# Patient Record
Sex: Male | Born: 1980 | Race: White | Hispanic: No | Marital: Single | Smoking: Former smoker
Health system: Southern US, Community
[De-identification: ages and names within clinical notes are randomized; demographics above are authoritative.]

## PROBLEM LIST (undated history)

## (undated) DIAGNOSIS — S2239XA Fracture of one rib, unspecified side, initial encounter for closed fracture: Secondary | ICD-10-CM

## (undated) DIAGNOSIS — W19XXXA Unspecified fall, initial encounter: Secondary | ICD-10-CM

## (undated) DIAGNOSIS — B958 Unspecified staphylococcus as the cause of diseases classified elsewhere: Secondary | ICD-10-CM

## (undated) DIAGNOSIS — L0390 Cellulitis, unspecified: Secondary | ICD-10-CM

## (undated) HISTORY — PX: NO PAST SURGERIES: SHX2092

---

## 2016-11-28 ENCOUNTER — Emergency Department (HOSPITAL_COMMUNITY)
Admission: EM | Admit: 2016-11-28 | Discharge: 2016-11-28 | Disposition: A | Discharge status: Home / Self Care | Payer: Self-pay | Attending: Emergency Medicine | Admitting: Emergency Medicine

## 2016-11-28 ENCOUNTER — Encounter (HOSPITAL_COMMUNITY): Payer: Self-pay | Admitting: Emergency Medicine

## 2016-11-28 DIAGNOSIS — Z720 Tobacco use: Secondary | ICD-10-CM

## 2016-11-28 DIAGNOSIS — K029 Dental caries, unspecified: Secondary | ICD-10-CM | POA: Insufficient documentation

## 2016-11-28 DIAGNOSIS — S025XXA Fracture of tooth (traumatic), initial encounter for closed fracture: Secondary | ICD-10-CM

## 2016-11-28 DIAGNOSIS — K0381 Cracked tooth: Secondary | ICD-10-CM | POA: Insufficient documentation

## 2016-11-28 MED ORDER — PENICILLIN V POTASSIUM 500 MG PO TABS: 1000.0000 mg | ORAL_TABLET | Freq: Two times a day (BID) | ORAL | 0 refills | Status: DC

## 2016-11-28 NOTE — Discharge Instructions (Signed)
Apply warm compresses to jaw throughout the day. Take antibiotic until finished. Use tylenol or motrin as needed for pain. Perform salt water swishes to help with pain/swelling. Use over the counter oragel as needed for additional relief. Followup with a dentist is very important for ongoing evaluation and management of recurrent dental pain, follow up with your dentist in the next 1 week for ongoing management of your dental issue. STOP SMOKING! Return to emergency department for emergent changing or worsening symptoms.

## 2016-11-28 NOTE — ED Triage Notes (Signed)
Patient reports left upper molar dental pain x2 days. States he was eating popcorn and a kernel "really messed up" his tooth. Denies fevers.

## 2016-11-28 NOTE — ED Provider Notes (Signed)
[facility]-EMERGENCY DEPT Provider Note   CSN: [id number] Arrival date & time: 11/28/16  1904     History   Chief Complaint Chief Complaint  Patient presents with  . Dental Pain    HPI Douglas Daniels is a 35 y.o. male who presents to the ED with complaints of left upper molar pain onset 3 days ago after he was eating popcorn and broke the tooth. He describes the pain as 9/10 burning intermittent nonradiating left upper dental pain worse with chewing and significantly improved with ibuprofen and BC powders. He has a Education officer, communitydentist, last saw him one year ago, is traveling from [place] and will not return until January 4 at which time he can see his dentist again. +Smoker. He denies gum swelling/drainage, face swelling, rhinorrhea, sore throat, ear pain/drainage, trismus, drooling, fevers, chills, CP, SOB, abd pain, N/V/D/C, hematuria, dysuria, myalgias, arthralgias, numbness, tingling, weakness, or any other complaints at this time. NKDA.    The history is provided by the patient and medical records. No language interpreter was used.  Dental Pain   This is a new problem. The current episode started more than 2 days ago. The problem occurs daily. The problem has not changed since onset.The pain is at a severity of 9/10. The pain is moderate. Treatments tried: ibuprofen and BC powders. The treatment provided significant relief.    History reviewed. No pertinent past medical history.  There are no active problems to display for this patient.   History reviewed. No pertinent surgical history.     Home Medications    Prior to Admission medications   Not on File    Family History No family history on file.  Social History Social History  Substance Use Topics  . Smoking status: Not on file  . Smokeless tobacco: Not on file  . Alcohol use Not on file     Allergies   Patient has no known allergies.   Review of Systems Review of Systems  Constitutional: Negative for chills and fever.    HENT: Positive for dental problem. Negative for drooling, ear discharge, ear pain, facial swelling, rhinorrhea, sore throat and trouble swallowing.   Respiratory: Negative for shortness of breath.   Cardiovascular: Negative for chest pain.  Gastrointestinal: Negative for abdominal pain, constipation, diarrhea, nausea and vomiting.  Genitourinary: Negative for dysuria and hematuria.  Musculoskeletal: Negative for arthralgias and myalgias.  Skin: Negative for color change.  Allergic/Immunologic: Negative for immunocompromised state.  Neurological: Negative for weakness and numbness.  Psychiatric/Behavioral: Negative for confusion.   10 Systems reviewed and are negative for acute change except as noted in the HPI.   Physical Exam Updated Vital Signs BP 138/98 (BP Location: Left Arm)   Pulse 84   Temp 98.2 F (36.8 C) (Oral)   Resp 18   Ht 6' (1.829 m)   Wt 89.8 kg   SpO2 97%   BMI 26.85 kg/m   Physical Exam  Constitutional: He is oriented to person, place, and time. Vital signs are normal. He appears well-developed and well-nourished.  Non-toxic appearance. No distress.  Afebrile, nontoxic, NAD  HENT:  Head: Normocephalic and atraumatic.  Nose: Nose normal.  Mouth/Throat: Uvula is midline, oropharynx is clear and moist and mucous membranes are normal. No trismus in the jaw. Abnormal dentition (broken tooth). Dental caries present. No dental abscesses or uvula swelling. Tonsils are 0 on the right. Tonsils are 0 on the left. No tonsillar exudate.  L upper molar #15 with small broken area to inner aspect  of the tooth, dental caries of the tooth, no surrounding gingival erythema or swelling, no evidence of abscess, no evidence of ludwig's. Nose clear. Oropharynx clear and moist, without uvular swelling or deviation, no trismus or drooling, no tonsillar swelling or erythema, no exudates.    Eyes: Conjunctivae and EOM are normal. Right eye exhibits no discharge. Left eye exhibits no  discharge.  Neck: Normal range of motion. Neck supple.  Cardiovascular: Normal rate and intact distal pulses.   Pulmonary/Chest: Effort normal. No respiratory distress.  Abdominal: Normal appearance. He exhibits no distension.  Musculoskeletal: Normal range of motion.  Neurological: He is alert and oriented to person, place, and time. He has normal strength. No sensory deficit.  Skin: Skin is warm, dry and intact. No rash noted.  Psychiatric: He has a normal mood and affect.  Nursing note and vitals reviewed.    ED Treatments / Results  Labs (all labs ordered are listed, but only abnormal results are displayed) Labs Reviewed - No data to display  EKG  EKG Interpretation None       Radiology No results found.  Procedures Procedures (including critical care time)  Medications Ordered in ED Medications - No data to display   Initial Impression / Assessment and Plan / ED Course  I have reviewed the triage vital signs and the nursing notes.  Pertinent labs & imaging results that were available during my care of the patient were reviewed by me and considered in my medical decision making (see chart for details).  Clinical Course     35 y.o. male here with Dental pain associated with broken tooth but without evidence of dental abscess with patient afebrile, non toxic appearing and swallowing secretions well, no evidence of ludwig's. I will also give PCN VK. Smoking cessation advised. Tylenol/motrin discussed. OTC remedies discussed for symptom control. Pt has a dentist at home, I stressed the importance of dental follow up for ultimate management of dental pain.  I have also discussed reasons to return immediately to the ER.  Patient expresses understanding and agrees with plan.    Final Clinical Impressions(s) / ED Diagnoses   Final diagnoses:  Pain due to dental caries  Closed fracture of tooth, initial encounter  Tobacco user    New Prescriptions New Prescriptions    PENICILLIN V POTASSIUM (VEETID) 500 MG TABLET    Take 2 tablets (1,000 mg total) by mouth 2 (two) times daily. X 7 days     Allen DerryMercedes Camprubi-Soms, PA-C 11/28/16 1945    Shaune Pollackameron Isaacs, MD 11/30/16 1135

## 2017-05-07 DIAGNOSIS — S2249XA Multiple fractures of ribs, unspecified side, initial encounter for closed fracture: Secondary | ICD-10-CM

## 2017-05-07 DIAGNOSIS — L0390 Cellulitis, unspecified: Secondary | ICD-10-CM

## 2017-05-07 HISTORY — DX: Cellulitis, unspecified: L03.90

## 2017-05-07 HISTORY — DX: Multiple fractures of ribs, unspecified side, initial encounter for closed fracture: S22.49XA

## 2017-05-15 ENCOUNTER — Emergency Department (HOSPITAL_COMMUNITY): Payer: Self-pay

## 2017-05-15 ENCOUNTER — Inpatient Hospital Stay (HOSPITAL_COMMUNITY)
Admission: EM | Admit: 2017-05-15 | Discharge: 2017-05-18 | DRG: 183 | Disposition: A | Discharge status: Home / Self Care | Payer: Self-pay | Attending: General Surgery | Admitting: General Surgery

## 2017-05-15 ENCOUNTER — Encounter (HOSPITAL_COMMUNITY): Payer: Self-pay | Admitting: Emergency Medicine

## 2017-05-15 DIAGNOSIS — S27321A Contusion of lung, unilateral, initial encounter: Secondary | ICD-10-CM | POA: Diagnosis present

## 2017-05-15 DIAGNOSIS — J9811 Atelectasis: Secondary | ICD-10-CM | POA: Diagnosis not present

## 2017-05-15 DIAGNOSIS — F17210 Nicotine dependence, cigarettes, uncomplicated: Secondary | ICD-10-CM | POA: Diagnosis present

## 2017-05-15 DIAGNOSIS — Y908 Blood alcohol level of 240 mg/100 ml or more: Secondary | ICD-10-CM | POA: Diagnosis present

## 2017-05-15 DIAGNOSIS — S2242XA Multiple fractures of ribs, left side, initial encounter for closed fracture: Principal | ICD-10-CM | POA: Diagnosis present

## 2017-05-15 DIAGNOSIS — J942 Hemothorax: Secondary | ICD-10-CM

## 2017-05-15 DIAGNOSIS — J939 Pneumothorax, unspecified: Secondary | ICD-10-CM

## 2017-05-15 DIAGNOSIS — R1010 Upper abdominal pain, unspecified: Secondary | ICD-10-CM | POA: Diagnosis present

## 2017-05-15 DIAGNOSIS — S2249XA Multiple fractures of ribs, unspecified side, initial encounter for closed fracture: Secondary | ICD-10-CM

## 2017-05-15 DIAGNOSIS — F10920 Alcohol use, unspecified with intoxication, uncomplicated: Secondary | ICD-10-CM

## 2017-05-15 DIAGNOSIS — F10120 Alcohol abuse with intoxication, uncomplicated: Secondary | ICD-10-CM | POA: Diagnosis present

## 2017-05-15 DIAGNOSIS — S2239XA Fracture of one rib, unspecified side, initial encounter for closed fracture: Secondary | ICD-10-CM

## 2017-05-15 DIAGNOSIS — S0081XA Abrasion of other part of head, initial encounter: Secondary | ICD-10-CM | POA: Diagnosis present

## 2017-05-15 DIAGNOSIS — S0083XA Contusion of other part of head, initial encounter: Secondary | ICD-10-CM | POA: Diagnosis present

## 2017-05-15 DIAGNOSIS — W19XXXA Unspecified fall, initial encounter: Secondary | ICD-10-CM

## 2017-05-15 DIAGNOSIS — S0031XA Abrasion of nose, initial encounter: Secondary | ICD-10-CM | POA: Diagnosis present

## 2017-05-15 DIAGNOSIS — S272XXA Traumatic hemopneumothorax, initial encounter: Secondary | ICD-10-CM | POA: Diagnosis present

## 2017-05-15 DIAGNOSIS — S40212A Abrasion of left shoulder, initial encounter: Secondary | ICD-10-CM | POA: Diagnosis present

## 2017-05-15 HISTORY — DX: Unspecified fall, initial encounter: W19.XXXA

## 2017-05-15 LAB — COMPREHENSIVE METABOLIC PANEL
ALT: 18 U/L (ref 17–63)
ANION GAP: 12 (ref 5–15)
AST: 30 U/L (ref 15–41)
Albumin: 4.5 g/dL (ref 3.5–5.0)
Alkaline Phosphatase: 67 U/L (ref 38–126)
BUN: 14 mg/dL (ref 6–20)
CO2: 19 mmol/L — ABNORMAL LOW (ref 22–32)
Calcium: 8.6 mg/dL — ABNORMAL LOW (ref 8.9–10.3)
Chloride: 108 mmol/L (ref 101–111)
Creatinine, Ser: 1.06 mg/dL (ref 0.61–1.24)
Glucose, Bld: 129 mg/dL — ABNORMAL HIGH (ref 65–99)
POTASSIUM: 3.8 mmol/L (ref 3.5–5.1)
Sodium: 139 mmol/L (ref 135–145)
TOTAL PROTEIN: 7.5 g/dL (ref 6.5–8.1)
Total Bilirubin: 0.5 mg/dL (ref 0.3–1.2)

## 2017-05-15 LAB — CBC
HEMATOCRIT: 46.8 % (ref 39.0–52.0)
HEMATOCRIT: 45.9 % (ref 39.0–52.0)
HEMOGLOBIN: 15.9 g/dL (ref 13.0–17.0)
Hemoglobin: 16 g/dL (ref 13.0–17.0)
MCH: 28.7 pg (ref 26.0–34.0)
MCH: 28.9 pg (ref 26.0–34.0)
MCHC: 34.2 g/dL (ref 30.0–36.0)
MCHC: 34.6 g/dL (ref 30.0–36.0)
MCV: 83.9 fL (ref 78.0–100.0)
MCV: 83.5 fL (ref 78.0–100.0)
Platelets: 233 10*3/uL (ref 150–400)
Platelets: 240 10*3/uL (ref 150–400)
RBC: 5.58 MIL/uL (ref 4.22–5.81)
RBC: 5.5 MIL/uL (ref 4.22–5.81)
RDW: 12.6 % (ref 11.5–15.5)
RDW: 12.7 % (ref 11.5–15.5)
WBC: 19.2 10*3/uL — AB (ref 4.0–10.5)
WBC: 17 10*3/uL — ABNORMAL HIGH (ref 4.0–10.5)

## 2017-05-15 LAB — URINALYSIS, ROUTINE W REFLEX MICROSCOPIC
BILIRUBIN URINE: NEGATIVE
Glucose, UA: NEGATIVE mg/dL
Hgb urine dipstick: NEGATIVE
KETONES UR: NEGATIVE mg/dL
LEUKOCYTES UA: NEGATIVE
NITRITE: NEGATIVE
PH: 5 (ref 5.0–8.0)
Protein, ur: NEGATIVE mg/dL
Specific Gravity, Urine: 1.017 (ref 1.005–1.030)

## 2017-05-15 LAB — I-STAT CG4 LACTIC ACID, ED: LACTIC ACID, VENOUS: 2.06 mmol/L — AB (ref 0.5–1.9)

## 2017-05-15 LAB — I-STAT CHEM 8, ED
BUN: 17 mg/dL (ref 6–20)
Calcium, Ion: 1.04 mmol/L — ABNORMAL LOW (ref 1.15–1.40)
Chloride: 107 mmol/L (ref 101–111)
Creatinine, Ser: 1.3 mg/dL — ABNORMAL HIGH (ref 0.61–1.24)
Glucose, Bld: 131 mg/dL — ABNORMAL HIGH (ref 65–99)
HCT: 48 % (ref 39.0–52.0)
Hemoglobin: 16.3 g/dL (ref 13.0–17.0)
Potassium: 4 mmol/L (ref 3.5–5.1)
Sodium: 142 mmol/L (ref 135–145)
TCO2: 22 mmol/L (ref 0–100)

## 2017-05-15 LAB — HIV ANTIBODY (ROUTINE TESTING W REFLEX): HIV Screen 4th Generation wRfx: NONREACTIVE

## 2017-05-15 LAB — SAMPLE TO BLOOD BANK

## 2017-05-15 LAB — PROTIME-INR
INR: 0.99
Prothrombin Time: 13.1 s (ref 11.4–15.2)

## 2017-05-15 LAB — CREATININE, SERUM
Creatinine, Ser: 0.95 mg/dL (ref 0.61–1.24)
GFR calc Af Amer: >60 mL/min (ref >60)
GFR calc non Af Amer: >60 mL/min (ref >60)

## 2017-05-15 LAB — ETHANOL: Alcohol, Ethyl (B): 292 mg/dL — ABNORMAL HIGH (ref <5)

## 2017-05-15 LAB — CDS SEROLOGY

## 2017-05-15 MED ORDER — IOPAMIDOL (ISOVUE-300) INJECTION 61%
INTRAVENOUS | Status: AC
  Administered 2017-05-15: 100 mL
  Filled 2017-05-15: qty 100

## 2017-05-15 MED ORDER — FENTANYL CITRATE (PF) 100 MCG/2ML IJ SOLN
INTRAMUSCULAR | Status: AC
  Filled 2017-05-15: qty 2

## 2017-05-15 MED ORDER — FENTANYL CITRATE (PF) 100 MCG/2ML IJ SOLN
INTRAMUSCULAR | Status: AC | PRN
  Administered 2017-05-15: 50 ug via INTRAVENOUS

## 2017-05-15 MED ORDER — OXYCODONE HCL 5 MG PO TABS: 5.0000 mg | ORAL_TABLET | ORAL | Status: DC | PRN

## 2017-05-15 MED ORDER — NICOTINE 14 MG/24HR TD PT24
14.0000 mg | MEDICATED_PATCH | Freq: Every day | TRANSDERMAL | Status: DC
  Administered 2017-05-15 – 2017-05-17 (×3): 14 mg via TRANSDERMAL
  Filled 2017-05-15 (×3): qty 1

## 2017-05-15 MED ORDER — SODIUM CHLORIDE 0.9 % IV SOLN: 250.0000 mL | INTRAVENOUS | Status: DC | PRN

## 2017-05-15 MED ORDER — TETANUS-DIPHTH-ACELL PERTUSSIS 5-2.5-18.5 LF-MCG/0.5 IM SUSP
INTRAMUSCULAR | Status: AC
  Filled 2017-05-15: qty 0.5

## 2017-05-15 MED ORDER — OXYCODONE HCL 5 MG PO TABS
10.0000 mg | ORAL_TABLET | ORAL | Status: DC | PRN
  Administered 2017-05-15 – 2017-05-17 (×12): 10 mg via ORAL
  Filled 2017-05-15 (×12): qty 2

## 2017-05-15 MED ORDER — TETANUS-DIPHTH-ACELL PERTUSSIS 5-2.5-18.5 LF-MCG/0.5 IM SUSP
0.5000 mL | Freq: Once | INTRAMUSCULAR | Status: AC
  Administered 2017-05-15: 0.5 mL via INTRAMUSCULAR

## 2017-05-15 MED ORDER — FENTANYL CITRATE (PF) 100 MCG/2ML IJ SOLN
25.0000 ug | INTRAMUSCULAR | Status: DC | PRN
  Administered 2017-05-15 (×2): 50 ug via INTRAVENOUS
  Filled 2017-05-15 (×2): qty 2

## 2017-05-15 MED ORDER — ACETAMINOPHEN 325 MG PO TABS
650.0000 mg | ORAL_TABLET | ORAL | Status: DC | PRN
  Administered 2017-05-15 – 2017-05-17 (×11): 650 mg via ORAL
  Filled 2017-05-15 (×11): qty 2

## 2017-05-15 MED ORDER — ENOXAPARIN SODIUM 40 MG/0.4ML ~~LOC~~ SOLN
40.0000 mg | SUBCUTANEOUS | Status: DC
  Administered 2017-05-16: 40 mg via SUBCUTANEOUS
  Filled 2017-05-15 (×3): qty 0.4

## 2017-05-15 MED ORDER — SODIUM CHLORIDE 0.9% FLUSH
3.0000 mL | Freq: Two times a day (BID) | INTRAVENOUS | Status: DC
  Administered 2017-05-16 (×2): 3 mL via INTRAVENOUS

## 2017-05-15 MED ORDER — FENTANYL CITRATE (PF) 100 MCG/2ML IJ SOLN
25.0000 ug | Freq: Once | INTRAMUSCULAR | Status: AC
  Administered 2017-05-15: 25 ug via INTRAVENOUS

## 2017-05-15 MED ORDER — SODIUM CHLORIDE 0.9% FLUSH: 3.0000 mL | INTRAVENOUS | Status: DC | PRN

## 2017-05-15 MED ORDER — SENNOSIDES-DOCUSATE SODIUM 8.6-50 MG PO TABS
2.0000 | ORAL_TABLET | Freq: Two times a day (BID) | ORAL | Status: DC
  Administered 2017-05-15 – 2017-05-18 (×4): 2 via ORAL
  Filled 2017-05-15 (×5): qty 2

## 2017-05-15 MED ORDER — ONDANSETRON HCL 4 MG/2ML IJ SOLN: 4.0000 mg | Freq: Four times a day (QID) | INTRAMUSCULAR | Status: DC | PRN

## 2017-05-15 MED ORDER — METHOCARBAMOL 1000 MG/10ML IJ SOLN
1000.0000 mg | Freq: Three times a day (TID) | INTRAMUSCULAR | Status: DC | PRN
  Administered 2017-05-15 – 2017-05-16 (×2): 1000 mg via INTRAVENOUS
  Filled 2017-05-15 (×5): qty 10

## 2017-05-15 MED ORDER — ONDANSETRON HCL 4 MG PO TABS: 4.0000 mg | ORAL_TABLET | Freq: Four times a day (QID) | ORAL | Status: DC | PRN

## 2017-05-15 NOTE — ED Notes (Signed)
MD at bedside. Pt resting comfortably.

## 2017-05-15 NOTE — ED Provider Notes (Signed)
[facility]-EMERGENCY DEPT Provider Note   CSN: [id number] Arrival date & time: 05/15/17  0111    By signing my name below, I, Douglas Daniels, attest that this documentation has been prepared under the direction and in the presence of Saga Balthazar, Jeannett Senior, MD . Electronically Signed: Elsie Daniels, ED Scribe. 05/15/2017. 1:17 AM.  History   Chief Complaint Chief Complaint  Patient presents with  . Chest Pain  . Alcohol Intoxication   LEVEL 5 CAVEAT: ALCOHOL INTOXICATION   HPI Douglas Daniels is a 36 y.o. male who brought in by EMS to the Emergency Department complaining of sudden-onset left sided chest wall pain s/p fall PTA. Pt is also experiencing associated abrasions to left sided face/left shoulder/bridge of nose and left upper abdominal pain. He was not given any medications by EMS while en route to the ED. EMS reports that the pt was found outside a bar downtown and informed EMS that he fell which caused his injuries. Pt endorses that he did consume ETOH tonight prior to the onset of his fall. Pt states that his left sided chest wall pain is exacerbated with inspiration. Pt reports his last Tetanus vaccination was 5 years ago. He denies neck pain and any other symptoms. Denies hx of abdominal surgeries.     The history is provided by the patient and the EMS personnel. No language interpreter was used.    History reviewed. No pertinent past medical history.  There are no active problems to display for this patient.   No past surgical history on file.     Home Medications    Prior to Admission medications   Medication Sig Start Date End Date Taking? Authorizing Provider  penicillin v potassium (VEETID) 500 MG tablet Take 2 tablets (1,000 mg total) by mouth 2 (two) times daily. X 7 days 11/28/16   Street, [place], [place]    Family History No family history on file.  Social History Social History  Substance Use Topics  . Smoking status: Current Every Day Smoker    Packs/day: 1.00      Types: Cigarettes  . Smokeless tobacco: Never Used  . Alcohol use Yes     Allergies   Patient has no known allergies.   Review of Systems Review of Systems  Unable to perform ROS: Other (Alcohol Intoxication)   Physical Exam Updated Vital Signs BP (!) 148/92   Pulse 90   Temp 98.1 F (36.7 C)   Resp 20   Ht 6\' 2"  (1.88 m)   Wt 200 lb (90.7 kg)   SpO2 93%   BMI 25.68 kg/m   Physical Exam  Constitutional: He is oriented to person, place, and time. He appears well-developed and well-nourished. No distress.  Uncooperative and belligerent.  HENT:  Head: Normocephalic. Head is with abrasion.  Mouth/Throat: Oropharynx is clear and moist. No oropharyngeal exudate.  Abrasions to left cheek and bridge of nose.   Eyes: Conjunctivae and EOM are normal. Pupils are equal, round, and reactive to light.  Neck: Normal range of motion. Neck supple.  No meningismus.  Cardiovascular: Normal rate, regular rhythm, normal heart sounds and intact distal pulses.   No murmur heard. Pulmonary/Chest: Effort normal. No respiratory distress. He has decreased breath sounds. He exhibits no crepitus.  No appreciated crepitus. Decreased breath sounds on the left lateral ribs with tenderness noted. No obvious ecchymosis to chest wall.   Abdominal: Soft. There is tenderness. There is no rebound and no guarding.  Left upper abdominal tenderness.  Musculoskeletal: Normal range  of motion. He exhibits no edema or tenderness.  Abrasion to left posterior shoulder. C-spine is non-tender. FROM. Able to move all extremities.  Neurological: He is alert and oriented to person, place, and time. No cranial nerve deficit. He exhibits normal muscle tone. Coordination normal.   5/5 strength throughout. CN 2-12 intact.Equal grip strength.   Skin: Skin is warm.  Psychiatric: He has a normal mood and affect. His behavior is normal.  Nursing note and vitals reviewed.    ED Treatments / Results  DIAGNOSTIC  STUDIES:  Oxygen Saturation is 93% on RA, low by my interpretation.    COORDINATION OF CARE:  1:32 AM Discussed treatment plan with pt at bedside and pt agreed to plan.  2:58 AM Consult with Trauma who will admitt to hospital.    Labs (all labs ordered are listed, but only abnormal results are displayed) Labs Reviewed  COMPREHENSIVE METABOLIC PANEL - Abnormal; Notable for the following:       Result Value   CO2 19 (*)    Glucose, Bld 129 (*)    Calcium 8.6 (*)    All other components within normal limits  CBC - Abnormal; Notable for the following:    WBC 19.2 (*)    All other components within normal limits  ETHANOL - Abnormal; Notable for the following:    Alcohol, Ethyl (B) 292 (*)    All other components within normal limits  I-STAT CHEM 8, ED - Abnormal; Notable for the following:    Creatinine, Ser 1.30 (*)    Glucose, Bld 131 (*)    Calcium, Ion 1.04 (*)    All other components within normal limits  I-STAT CG4 LACTIC ACID, ED - Abnormal; Notable for the following:    Lactic Acid, Venous 2.06 (*)    All other components within normal limits  CDS SEROLOGY  URINALYSIS, ROUTINE W REFLEX MICROSCOPIC  PROTIME-INR  HIV ANTIBODY (ROUTINE TESTING)  CBC  CREATININE, SERUM  SAMPLE TO BLOOD BANK    EKG  EKG Interpretation  Date/Time:  Saturday May 15 2017 05:01:04 EDT Ventricular Rate:  93 PR Interval:    QRS Duration: 87 QT Interval:  339 QTC Calculation: 422 R Axis:   59 Text Interpretation:  Sinus rhythm Probable anteroseptal infarct, old No previous ECGs available Confirmed by Glynn Octave [phone]) on 05/15/2017 5:22:15 AM       Radiology Ct Head Wo Contrast  Result Date: 05/15/2017 CLINICAL DATA:  Initial evaluation for acute trauma, fall. EXAM: CT HEAD WITHOUT CONTRAST CT MAXILLOFACIAL WITHOUT CONTRAST CT CERVICAL SPINE WITHOUT CONTRAST TECHNIQUE: Multidetector CT imaging of the head, cervical spine, and maxillofacial structures were performed using the  standard protocol without intravenous contrast. Multiplanar CT image reconstructions of the cervical spine and maxillofacial structures were also generated. COMPARISON:  None. FINDINGS: CT HEAD FINDINGS Brain: Cerebral volume normal. No acute intracranial hemorrhage. No evidence for acute large vessel territory infarct. No mass lesion, midline shift or mass effect. No hydrocephalus. Mild prominence of the extra-axial space overlying the left frontal convexity without discrete extra-axial fluid collection. Vascular: No asymmetric hyperdense vessel. Skull: Scalp soft tissues demonstrate no acute abnormality. Calvarium intact. Other: No mastoid effusion. CT MAXILLOFACIAL FINDINGS Osseous: Zygomatic arches intact. No acute maxillary fracture. Pterygoid plates intact. Nasal bones intact. Nasal septum bowed to the left but intact. Visualized mandible intact. Mandibular condyles normally situated. No acute abnormality about the dentition. Scattered dental caries noted. Orbits: Globes oral soft tissues within normal limits. Bony orbits intact. Sinuses: Clear.  Soft tissues: Mild soft tissue swelling at the lateral left face. No other appreciable soft tissue injury. CT CERVICAL SPINE FINDINGS Alignment: Mild dextroscoliosis, which may in part be related positioning. Vertebral bodies otherwise normally aligned with preservation of the normal cervical lordosis. Skull base and vertebrae: Skullbase intact. Normal C1-2 articulations preserved. Dens is intact. Vertebral body heights maintained. No acute fracture. Soft tissues and spinal canal: Soft tissues of the neck demonstrate no acute abnormality. No prevertebral edema. Disc levels:  Mild degenerative spondylolysis noted at C5-6. Upper chest: Visualized upper chest demonstrates no acute abnormality. Visualized lung apices are clear. No apical pneumothorax. IMPRESSION: CT HEAD: No acute intracranial process identified. CT MAXILLOFACIAL: 1. Mild left facial soft tissue  swelling/contusion. 2. No other acute maxillofacial injury identified.  No fracture. CT CERVICAL SPINE: No acute traumatic injury within cervical spine. Electronically Signed   By: Rise MuBenjamin  McClintock M.D.   On: 05/15/2017 02:37   Ct Chest W Contrast  Result Date: 05/15/2017 CLINICAL DATA:  Left chest wall pain.  Level 2 trauma.  Fall. EXAM: CT CHEST, ABDOMEN, AND PELVIS WITH CONTRAST TECHNIQUE: Multidetector CT imaging of the chest, abdomen and pelvis was performed following the standard protocol during bolus administration of intravenous contrast. CONTRAST:  100mL ISOVUE-300 IOPAMIDOL (ISOVUE-300) INJECTION 61% COMPARISON:  Chest and pelvic radiographs earlier this day. FINDINGS: CT CHEST FINDINGS Cardiovascular: No acute traumatic aortic injury. The heart is normal in size. No pericardial fluid. Mediastinum/Nodes: No pneumomediastinum. No mediastinal hematoma. No adenopathy. Visualized thyroid gland is normal. The esophagus is decompressed. Small hiatal hernia. Lungs/Pleura: Tiny left pneumothorax most prominent anteriorly. Patchy left lower lobe opacities may be atelectasis or mild contusion. Trace left hemothorax. Probable dependent atelectasis in the right lower lobe. Trachea and mainstem bronchi are patent. Minimal adherent mucus in the upper trachea. Musculoskeletal: Fractures of left anterior third through seventh ribs, fourth through sixth rib fractures minimally displaced. Tiny adjacent extrapleural air and pneumothorax. Motion artifact partially obscures evaluation of lower most ribs. The sternum is intact. No fracture or subluxation of the thoracic spine. Visualize clavicles and shoulder girdles are intact. Soft tissue stranding of the left anterior lateral chest wall. CT ABDOMEN PELVIS FINDINGS Hepatobiliary: No hepatic injury or perihepatic hematoma. Gallbladder is unremarkable Pancreas: No ductal dilatation or inflammation. Spleen: No splenic injury or perisplenic hematoma. Adrenals/Urinary  Tract: No adrenal hemorrhage or renal injury identified. Bladder is unremarkable. Stomach/Bowel: No evidence of bowel injury. No bowel wall thickening. No mesenteric hematoma. Normal appendix. Vascular/Lymphatic: No acute vascular injury. Abdominal aorta and IVC are intact. No retroperitoneal fluid. No adenopathy. Reproductive: Normal. Other: Tiny foci of extraperitoneal air in the left upper abdomen are likely tracking from rib fractures. No free fluid or ascites. Musculoskeletal: No fracture of the bony pelvis or lumbar spine. IMPRESSION: 1. Left anterior rib fractures 3-7, fourth through sixth fractures are displaced. Tiny associated left hemopneumothorax. Probable mild pulmonary contusion in the left lower lobe. 2. No additional acute traumatic injury to the chest, abdomen, or pelvis. 3. Small hiatal hernia, incidentally noted. Critical Value/emergent results were called by telephone at the time of interpretation on 05/15/2017 at 2:19 am to Dr. Glynn OctaveSTEPHEN Dierre Crevier , who verbally acknowledged these results. Electronically Signed   By: Rubye OaksMelanie  Ehinger M.D.   On: 05/15/2017 02:19   Ct Cervical Spine Wo Contrast  Result Date: 05/15/2017 CLINICAL DATA:  Initial evaluation for acute trauma, fall. EXAM: CT HEAD WITHOUT CONTRAST CT MAXILLOFACIAL WITHOUT CONTRAST CT CERVICAL SPINE WITHOUT CONTRAST TECHNIQUE: Multidetector CT imaging of the head,  cervical spine, and maxillofacial structures were performed using the standard protocol without intravenous contrast. Multiplanar CT image reconstructions of the cervical spine and maxillofacial structures were also generated. COMPARISON:  None. FINDINGS: CT HEAD FINDINGS Brain: Cerebral volume normal. No acute intracranial hemorrhage. No evidence for acute large vessel territory infarct. No mass lesion, midline shift or mass effect. No hydrocephalus. Mild prominence of the extra-axial space overlying the left frontal convexity without discrete extra-axial fluid collection.  Vascular: No asymmetric hyperdense vessel. Skull: Scalp soft tissues demonstrate no acute abnormality. Calvarium intact. Other: No mastoid effusion. CT MAXILLOFACIAL FINDINGS Osseous: Zygomatic arches intact. No acute maxillary fracture. Pterygoid plates intact. Nasal bones intact. Nasal septum bowed to the left but intact. Visualized mandible intact. Mandibular condyles normally situated. No acute abnormality about the dentition. Scattered dental caries noted. Orbits: Globes oral soft tissues within normal limits. Bony orbits intact. Sinuses: Clear. Soft tissues: Mild soft tissue swelling at the lateral left face. No other appreciable soft tissue injury. CT CERVICAL SPINE FINDINGS Alignment: Mild dextroscoliosis, which may in part be related positioning. Vertebral bodies otherwise normally aligned with preservation of the normal cervical lordosis. Skull base and vertebrae: Skullbase intact. Normal C1-2 articulations preserved. Dens is intact. Vertebral body heights maintained. No acute fracture. Soft tissues and spinal canal: Soft tissues of the neck demonstrate no acute abnormality. No prevertebral edema. Disc levels:  Mild degenerative spondylolysis noted at C5-6. Upper chest: Visualized upper chest demonstrates no acute abnormality. Visualized lung apices are clear. No apical pneumothorax. IMPRESSION: CT HEAD: No acute intracranial process identified. CT MAXILLOFACIAL: 1. Mild left facial soft tissue swelling/contusion. 2. No other acute maxillofacial injury identified.  No fracture. CT CERVICAL SPINE: No acute traumatic injury within cervical spine. Electronically Signed   By: Rise Mu M.D.   On: 05/15/2017 02:37   Ct Abdomen Pelvis W Contrast  Result Date: 05/15/2017 CLINICAL DATA:  Left chest wall pain.  Level 2 trauma.  Fall. EXAM: CT CHEST, ABDOMEN, AND PELVIS WITH CONTRAST TECHNIQUE: Multidetector CT imaging of the chest, abdomen and pelvis was performed following the standard protocol  during bolus administration of intravenous contrast. CONTRAST:  ISOVUE-300 IOPAMIDOL (ISOVUE-300) INJECTION 61% COMPARISON:  Chest and pelvic radiographs earlier this day. FINDINGS: CT CHEST FINDINGS Cardiovascular: No acute traumatic aortic injury. The heart is normal in size. No pericardial fluid. Mediastinum/Nodes: No pneumomediastinum. No mediastinal hematoma. No adenopathy. Visualized thyroid gland is normal. The esophagus is decompressed. Small hiatal hernia. Lungs/Pleura: Tiny left pneumothorax most prominent anteriorly. Patchy left lower lobe opacities may be atelectasis or mild contusion. Trace left hemothorax. Probable dependent atelectasis in the right lower lobe. Trachea and mainstem bronchi are patent. Minimal adherent mucus in the upper trachea. Musculoskeletal: Fractures of left anterior third through seventh ribs, fourth through sixth rib fractures minimally displaced. Tiny adjacent extrapleural air and pneumothorax. Motion artifact partially obscures evaluation of lower most ribs. The sternum is intact. No fracture or subluxation of the thoracic spine. Visualize clavicles and shoulder girdles are intact. Soft tissue stranding of the left anterior lateral chest wall. CT ABDOMEN PELVIS FINDINGS Hepatobiliary: No hepatic injury or perihepatic hematoma. Gallbladder is unremarkable Pancreas: No ductal dilatation or inflammation. Spleen: No splenic injury or perisplenic hematoma. Adrenals/Urinary Tract: No adrenal hemorrhage or renal injury identified. Bladder is unremarkable. Stomach/Bowel: No evidence of bowel injury. No bowel wall thickening. No mesenteric hematoma. Normal appendix. Vascular/Lymphatic: No acute vascular injury. Abdominal aorta and IVC are intact. No retroperitoneal fluid. No adenopathy. Reproductive: Normal. Other: Tiny foci of extraperitoneal air  in the left upper abdomen are likely tracking from rib fractures. No free fluid or ascites. Musculoskeletal: No fracture of the bony  pelvis or lumbar spine. IMPRESSION: 1. Left anterior rib fractures 3-7, fourth through sixth fractures are displaced. Tiny associated left hemopneumothorax. Probable mild pulmonary contusion in the left lower lobe. 2. No additional acute traumatic injury to the chest, abdomen, or pelvis. 3. Small hiatal hernia, incidentally noted. Critical Value/emergent results were called by telephone at the time of interpretation on 05/15/2017 at 2:19 am to Dr. Glynn Octave , who verbally acknowledged these results. Electronically Signed   By: Rubye Oaks M.D.   On: 05/15/2017 02:19   Dg Pelvis Portable  Result Date: 05/15/2017 CLINICAL DATA:  Possible fall, level 2 trauma. EXAM: PORTABLE PELVIS 1-2 VIEWS COMPARISON:  None. FINDINGS: The cortical margins of the bony pelvis are intact. No fracture. Pubic symphysis and sacroiliac joints are congruent. Both femoral heads are well-seated in the respective acetabula. IMPRESSION: No evidence of pelvic fracture. Electronically Signed   By: Rubye Oaks M.D.   On: 05/15/2017 01:47   Dg Chest Port 1 View  Result Date: 05/15/2017 CLINICAL DATA:  Chest pain.  Possible fall.  Level 2 trauma. EXAM: PORTABLE CHEST 1 VIEW COMPARISON:  None. FINDINGS: Low lung volumes. Heart size is normal. No large pneumothorax, pleural effusion or focal airspace disease. No evidence of displaced rib fracture. IMPRESSION: Low lung volumes without evidence of acute traumatic injury. Chest CT is planned. Electronically Signed   By: Rubye Oaks M.D.   On: 05/15/2017 01:46   Ct Maxillofacial Wo Contrast  Result Date: 05/15/2017 CLINICAL DATA:  Initial evaluation for acute trauma, fall. EXAM: CT HEAD WITHOUT CONTRAST CT MAXILLOFACIAL WITHOUT CONTRAST CT CERVICAL SPINE WITHOUT CONTRAST TECHNIQUE: Multidetector CT imaging of the head, cervical spine, and maxillofacial structures were performed using the standard protocol without intravenous contrast. Multiplanar CT image reconstructions of the  cervical spine and maxillofacial structures were also generated. COMPARISON:  None. FINDINGS: CT HEAD FINDINGS Brain: Cerebral volume normal. No acute intracranial hemorrhage. No evidence for acute large vessel territory infarct. No mass lesion, midline shift or mass effect. No hydrocephalus. Mild prominence of the extra-axial space overlying the left frontal convexity without discrete extra-axial fluid collection. Vascular: No asymmetric hyperdense vessel. Skull: Scalp soft tissues demonstrate no acute abnormality. Calvarium intact. Other: No mastoid effusion. CT MAXILLOFACIAL FINDINGS Osseous: Zygomatic arches intact. No acute maxillary fracture. Pterygoid plates intact. Nasal bones intact. Nasal septum bowed to the left but intact. Visualized mandible intact. Mandibular condyles normally situated. No acute abnormality about the dentition. Scattered dental caries noted. Orbits: Globes oral soft tissues within normal limits. Bony orbits intact. Sinuses: Clear. Soft tissues: Mild soft tissue swelling at the lateral left face. No other appreciable soft tissue injury. CT CERVICAL SPINE FINDINGS Alignment: Mild dextroscoliosis, which may in part be related positioning. Vertebral bodies otherwise normally aligned with preservation of the normal cervical lordosis. Skull base and vertebrae: Skullbase intact. Normal C1-2 articulations preserved. Dens is intact. Vertebral body heights maintained. No acute fracture. Soft tissues and spinal canal: Soft tissues of the neck demonstrate no acute abnormality. No prevertebral edema. Disc levels:  Mild degenerative spondylolysis noted at C5-6. Upper chest: Visualized upper chest demonstrates no acute abnormality. Visualized lung apices are clear. No apical pneumothorax. IMPRESSION: CT HEAD: No acute intracranial process identified. CT MAXILLOFACIAL: 1. Mild left facial soft tissue swelling/contusion. 2. No other acute maxillofacial injury identified.  No fracture. CT CERVICAL  SPINE: No acute traumatic injury  within cervical spine. Electronically Signed   By: Rise Mu M.D.   On: 05/15/2017 02:37    Procedures Procedures (including critical care time)  Medications Ordered in ED Medications  fentaNYL (SUBLIMAZE) injection ( Intravenous Canceled Entry 05/15/17 0130)  Tdap (BOOSTRIX) injection 0.5 mL (0.5 mLs Intramuscular Given 05/15/17 0204)  iopamidol (ISOVUE-300) 61 % injection (100 mLs  Contrast Given 05/15/17 0136)     Initial Impression / Assessment and Plan / ED Course  I have reviewed the triage vital signs and the nursing notes.  Pertinent labs & imaging results that were available during my care of the patient were reviewed by me and considered in my medical decision making (see chart for details).     Patient is intoxicated and belligerent. He had an apparent fall while drinking alcohol. Has abrasions to his left face, neck and chest. EMS reports decreased breath sounds in the left   ABCs are intact. Breath sounds are present. There is no obvious pneumothorax on bedside chest x-ray.  Patient given fentanyl for pain control. CT imaging will be obtained.  Imaging is remarkable for multiple left-sided rib fractures with small hemopneumothorax. CT head and C-spine are negative.  Patient's pain controlled with fentanyl. He does have minimal oxygen requirement. Given his multiple rib fractures and small hemopneumothorax, discussed with trauma surgery Dr. Janee Morn. Patient will be admitted for observation, pulmonary toilet and pain control.  EMERGENCY DEPARTMENT [place] FAST EXAM "Limited Ultrasound of the Abdomen and Pericardium" (FAST Exam).   INDICATIONS:Blunt trauma to the thorax and Blunt injury of abdomen Multiple views of the abdomen and pericardium are obtained with a multi-frequency probe.  PERFORMED BY: Myself IMAGES ARCHIVED?: Yes LIMITATIONS:  Emergent procedure INTERPRETATION:  No abdominal free fluid and No pericardial  effusion    Final Clinical Impressions(s) / ED Diagnoses   Final diagnoses:  Alcoholic intoxication without complication (HCC)  Closed fracture of multiple ribs of left side, initial encounter  Hemopneumothorax on left    New Prescriptions New Prescriptions   No medications on file  I personally performed the services described in this documentation, which was scribed in my presence. The recorded information has been reviewed and is accurate.     Glynn Octave, MD 05/15/17 [phone]

## 2017-05-15 NOTE — ED Notes (Signed)
Escorted to CT. Pt apologetic to staff.

## 2017-05-15 NOTE — H&P (Signed)
Douglas Daniels is an 36 y.o. male.   Chief Complaint: Left rib pain HPI: Douglas Daniels was brought in by EMS as a level 2 trauma after being found down outside a bar [place]. He had been drinking and he fell on his left side. He was complaining of left-sided pain exacerbated by deep breaths. Workup in the emergency department demonstrated left-sided rib fractures 3-7 with tiny occult PTX. I was asked to see him for admission.  History reviewed. No pertinent past medical history.  No past surgical history on file.  No family history on file. Social History:  reports that he has been smoking Cigarettes.  He has been smoking about 1.00 pack per day. He has never used smokeless tobacco. He reports that he drinks alcohol. He reports that he does not use drugs.  Allergies: No Known Allergies   (Not in a hospital admission)  Results for orders placed or performed during the hospital encounter of 05/15/17 (from the past 48 hour(s))  Sample to Blood Bank     Status: None   Collection Time: 05/15/17  1:14 AM  Result Value Ref Range   Blood Bank Specimen SAMPLE AVAILABLE FOR TESTING    Sample Expiration 05/16/2017   Ethanol     Status: Abnormal   Collection Time: 05/15/17  1:17 AM  Result Value Ref Range   Alcohol, Ethyl (B) 292 (H) <5 mg/dL    Comment:        LOWEST DETECTABLE LIMIT FOR SERUM ALCOHOL IS 5 mg/dL FOR MEDICAL PURPOSES ONLY   CDS serology     Status: None   Collection Time: 05/15/17  1:30 AM  Result Value Ref Range   CDS serology specimen      SPECIMEN WILL BE HELD FOR 14 DAYS IF TESTING IS REQUIRED  Comprehensive metabolic panel     Status: Abnormal   Collection Time: 05/15/17  1:30 AM  Result Value Ref Range   Sodium 139 135 - 145 mmol/L   Potassium 3.8 3.5 - 5.1 mmol/L   Chloride 108 101 - 111 mmol/L   CO2 19 (L) 22 - 32 mmol/L   Glucose, Bld 129 (H) 65 - 99 mg/dL   BUN 14 6 - 20 mg/dL   Creatinine, Ser 1.06 0.61 - 1.24 mg/dL   Calcium 8.6 (L) 8.9 - 10.3 mg/dL   Total  Protein 7.5 6.5 - 8.1 g/dL   Albumin 4.5 3.5 - 5.0 g/dL   AST 30 15 - 41 U/L   ALT 18 17 - 63 U/L   Alkaline Phosphatase 67 38 - 126 U/L   Total Bilirubin 0.5 0.3 - 1.2 mg/dL   GFR calc non Af Amer >60 >60 mL/min   GFR calc Af Amer >60 >60 mL/min    Comment: (NOTE) The eGFR has been calculated using the CKD EPI equation. This calculation has not been validated in all clinical situations. eGFR's persistently <60 mL/min signify possible Chronic Kidney Disease.    Anion gap 12 5 - 15  CBC     Status: Abnormal   Collection Time: 05/15/17  1:30 AM  Result Value Ref Range   WBC 19.2 (H) 4.0 - 10.5 K/uL   RBC 5.58 4.22 - 5.81 MIL/uL   Hemoglobin 16.0 13.0 - 17.0 g/dL   HCT 46.8 39.0 - 52.0 %   MCV 83.9 78.0 - 100.0 fL   MCH 28.7 26.0 - 34.0 pg   MCHC 34.2 30.0 - 36.0 g/dL   RDW 12.6 11.5 - 15.5 %  Platelets 233 150 - 400 K/uL  Protime-INR     Status: None   Collection Time: 05/15/17  1:30 AM  Result Value Ref Range   Prothrombin Time 13.1 11.4 - 15.2 seconds   INR 0.99   I-Stat Chem 8, ED     Status: Abnormal   Collection Time: 05/15/17  1:39 AM  Result Value Ref Range   Sodium 142 135 - 145 mmol/L   Potassium 4.0 3.5 - 5.1 mmol/L   Chloride 107 101 - 111 mmol/L   BUN 17 6 - 20 mg/dL   Creatinine, Ser 1.30 (H) 0.61 - 1.24 mg/dL   Glucose, Bld 131 (H) 65 - 99 mg/dL   Calcium, Ion 1.04 (L) 1.15 - 1.40 mmol/L   TCO2 22 0 - 100 mmol/L   Hemoglobin 16.3 13.0 - 17.0 g/dL   HCT 48.0 39.0 - 52.0 %  I-Stat CG4 Lactic Acid, ED     Status: Abnormal   Collection Time: 05/15/17  1:40 AM  Result Value Ref Range   Lactic Acid, Venous 2.06 (HH) 0.5 - 1.9 mmol/L   Comment NOTIFIED PHYSICIAN    Ct Head Wo Contrast  Result Date: 05/15/2017 CLINICAL DATA:  Initial evaluation for acute trauma, fall. EXAM: CT HEAD WITHOUT CONTRAST CT MAXILLOFACIAL WITHOUT CONTRAST CT CERVICAL SPINE WITHOUT CONTRAST TECHNIQUE: Multidetector CT imaging of the head, cervical spine, and maxillofacial structures  were performed using the standard protocol without intravenous contrast. Multiplanar CT image reconstructions of the cervical spine and maxillofacial structures were also generated. COMPARISON:  None. FINDINGS: CT HEAD FINDINGS Brain: Cerebral volume normal. No acute intracranial hemorrhage. No evidence for acute large vessel territory infarct. No mass lesion, midline shift or mass effect. No hydrocephalus. Mild prominence of the extra-axial space overlying the left frontal convexity without discrete extra-axial fluid collection. Vascular: No asymmetric hyperdense vessel. Skull: Scalp soft tissues demonstrate no acute abnormality. Calvarium intact. Other: No mastoid effusion. CT MAXILLOFACIAL FINDINGS Osseous: Zygomatic arches intact. No acute maxillary fracture. Pterygoid plates intact. Nasal bones intact. Nasal septum bowed to the left but intact. Visualized mandible intact. Mandibular condyles normally situated. No acute abnormality about the dentition. Scattered dental caries noted. Orbits: Globes oral soft tissues within normal limits. Bony orbits intact. Sinuses: Clear. Soft tissues: Mild soft tissue swelling at the lateral left face. No other appreciable soft tissue injury. CT CERVICAL SPINE FINDINGS Alignment: Mild dextroscoliosis, which may in part be related positioning. Vertebral bodies otherwise normally aligned with preservation of the normal cervical lordosis. Skull base and vertebrae: Skullbase intact. Normal C1-2 articulations preserved. Dens is intact. Vertebral body heights maintained. No acute fracture. Soft tissues and spinal canal: Soft tissues of the neck demonstrate no acute abnormality. No prevertebral edema. Disc levels:  Mild degenerative spondylolysis noted at C5-6. Upper chest: Visualized upper chest demonstrates no acute abnormality. Visualized lung apices are clear. No apical pneumothorax. IMPRESSION: CT HEAD: No acute intracranial process identified. CT MAXILLOFACIAL: 1. Mild left  facial soft tissue swelling/contusion. 2. No other acute maxillofacial injury identified.  No fracture. CT CERVICAL SPINE: No acute traumatic injury within cervical spine. Electronically Signed   By: Jeannine Boga M.D.   On: 05/15/2017 02:37   Ct Chest W Contrast  Result Date: 05/15/2017 CLINICAL DATA:  Left chest wall pain.  Level 2 trauma.  Fall. EXAM: CT CHEST, ABDOMEN, AND PELVIS WITH CONTRAST TECHNIQUE: Multidetector CT imaging of the chest, abdomen and pelvis was performed following the standard protocol during bolus administration of intravenous contrast. CONTRAST:  169m ISOVUE-300  IOPAMIDOL (ISOVUE-300) INJECTION 61% COMPARISON:  Chest and pelvic radiographs earlier this day. FINDINGS: CT CHEST FINDINGS Cardiovascular: No acute traumatic aortic injury. The heart is normal in size. No pericardial fluid. Mediastinum/Nodes: No pneumomediastinum. No mediastinal hematoma. No adenopathy. Visualized thyroid gland is normal. The esophagus is decompressed. Small hiatal hernia. Lungs/Pleura: Tiny left pneumothorax most prominent anteriorly. Patchy left lower lobe opacities may be atelectasis or mild contusion. Trace left hemothorax. Probable dependent atelectasis in the right lower lobe. Trachea and mainstem bronchi are patent. Minimal adherent mucus in the upper trachea. Musculoskeletal: Fractures of left anterior third through seventh ribs, fourth through sixth rib fractures minimally displaced. Tiny adjacent extrapleural air and pneumothorax. Motion artifact partially obscures evaluation of lower most ribs. The sternum is intact. No fracture or subluxation of the thoracic spine. Visualize clavicles and shoulder girdles are intact. Soft tissue stranding of the left anterior lateral chest wall. CT ABDOMEN PELVIS FINDINGS Hepatobiliary: No hepatic injury or perihepatic hematoma. Gallbladder is unremarkable Pancreas: No ductal dilatation or inflammation. Spleen: No splenic injury or perisplenic hematoma.  Adrenals/Urinary Tract: No adrenal hemorrhage or renal injury identified. Bladder is unremarkable. Stomach/Bowel: No evidence of bowel injury. No bowel wall thickening. No mesenteric hematoma. Normal appendix. Vascular/Lymphatic: No acute vascular injury. Abdominal aorta and IVC are intact. No retroperitoneal fluid. No adenopathy. Reproductive: Normal. Other: Tiny foci of extraperitoneal air in the left upper abdomen are likely tracking from rib fractures. No free fluid or ascites. Musculoskeletal: No fracture of the bony pelvis or lumbar spine. IMPRESSION: 1. Left anterior rib fractures 3-7, fourth through sixth fractures are displaced. Tiny associated left hemopneumothorax. Probable mild pulmonary contusion in the left lower lobe. 2. No additional acute traumatic injury to the chest, abdomen, or pelvis. 3. Small hiatal hernia, incidentally noted. Critical Value/emergent results were called by telephone at the time of interpretation on 05/15/2017 at 2:19 am to Dr. Ezequiel Essex , who verbally acknowledged these results. Electronically Signed   By: Jeb Levering M.D.   On: 05/15/2017 02:19   Ct Cervical Spine Wo Contrast  Result Date: 05/15/2017 CLINICAL DATA:  Initial evaluation for acute trauma, fall. EXAM: CT HEAD WITHOUT CONTRAST CT MAXILLOFACIAL WITHOUT CONTRAST CT CERVICAL SPINE WITHOUT CONTRAST TECHNIQUE: Multidetector CT imaging of the head, cervical spine, and maxillofacial structures were performed using the standard protocol without intravenous contrast. Multiplanar CT image reconstructions of the cervical spine and maxillofacial structures were also generated. COMPARISON:  None. FINDINGS: CT HEAD FINDINGS Brain: Cerebral volume normal. No acute intracranial hemorrhage. No evidence for acute large vessel territory infarct. No mass lesion, midline shift or mass effect. No hydrocephalus. Mild prominence of the extra-axial space overlying the left frontal convexity without discrete extra-axial fluid  collection. Vascular: No asymmetric hyperdense vessel. Skull: Scalp soft tissues demonstrate no acute abnormality. Calvarium intact. Other: No mastoid effusion. CT MAXILLOFACIAL FINDINGS Osseous: Zygomatic arches intact. No acute maxillary fracture. Pterygoid plates intact. Nasal bones intact. Nasal septum bowed to the left but intact. Visualized mandible intact. Mandibular condyles normally situated. No acute abnormality about the dentition. Scattered dental caries noted. Orbits: Globes oral soft tissues within normal limits. Bony orbits intact. Sinuses: Clear. Soft tissues: Mild soft tissue swelling at the lateral left face. No other appreciable soft tissue injury. CT CERVICAL SPINE FINDINGS Alignment: Mild dextroscoliosis, which may in part be related positioning. Vertebral bodies otherwise normally aligned with preservation of the normal cervical lordosis. Skull base and vertebrae: Skullbase intact. Normal C1-2 articulations preserved. Dens is intact. Vertebral body heights maintained. No acute fracture.  Soft tissues and spinal canal: Soft tissues of the neck demonstrate no acute abnormality. No prevertebral edema. Disc levels:  Mild degenerative spondylolysis noted at C5-6. Upper chest: Visualized upper chest demonstrates no acute abnormality. Visualized lung apices are clear. No apical pneumothorax. IMPRESSION: CT HEAD: No acute intracranial process identified. CT MAXILLOFACIAL: 1. Mild left facial soft tissue swelling/contusion. 2. No other acute maxillofacial injury identified.  No fracture. CT CERVICAL SPINE: No acute traumatic injury within cervical spine. Electronically Signed   By: Jeannine Boga M.D.   On: 05/15/2017 02:37   Ct Abdomen Pelvis W Contrast  Result Date: 05/15/2017 CLINICAL DATA:  Left chest wall pain.  Level 2 trauma.  Fall. EXAM: CT CHEST, ABDOMEN, AND PELVIS WITH CONTRAST TECHNIQUE: Multidetector CT imaging of the chest, abdomen and pelvis was performed following the standard  protocol during bolus administration of intravenous contrast. CONTRAST:  172m ISOVUE-300 IOPAMIDOL (ISOVUE-300) INJECTION 61% COMPARISON:  Chest and pelvic radiographs earlier this day. FINDINGS: CT CHEST FINDINGS Cardiovascular: No acute traumatic aortic injury. The heart is normal in size. No pericardial fluid. Mediastinum/Nodes: No pneumomediastinum. No mediastinal hematoma. No adenopathy. Visualized thyroid gland is normal. The esophagus is decompressed. Small hiatal hernia. Lungs/Pleura: Tiny left pneumothorax most prominent anteriorly. Patchy left lower lobe opacities may be atelectasis or mild contusion. Trace left hemothorax. Probable dependent atelectasis in the right lower lobe. Trachea and mainstem bronchi are patent. Minimal adherent mucus in the upper trachea. Musculoskeletal: Fractures of left anterior third through seventh ribs, fourth through sixth rib fractures minimally displaced. Tiny adjacent extrapleural air and pneumothorax. Motion artifact partially obscures evaluation of lower most ribs. The sternum is intact. No fracture or subluxation of the thoracic spine. Visualize clavicles and shoulder girdles are intact. Soft tissue stranding of the left anterior lateral chest wall. CT ABDOMEN PELVIS FINDINGS Hepatobiliary: No hepatic injury or perihepatic hematoma. Gallbladder is unremarkable Pancreas: No ductal dilatation or inflammation. Spleen: No splenic injury or perisplenic hematoma. Adrenals/Urinary Tract: No adrenal hemorrhage or renal injury identified. Bladder is unremarkable. Stomach/Bowel: No evidence of bowel injury. No bowel wall thickening. No mesenteric hematoma. Normal appendix. Vascular/Lymphatic: No acute vascular injury. Abdominal aorta and IVC are intact. No retroperitoneal fluid. No adenopathy. Reproductive: Normal. Other: Tiny foci of extraperitoneal air in the left upper abdomen are likely tracking from rib fractures. No free fluid or ascites. Musculoskeletal: No fracture of  the bony pelvis or lumbar spine. IMPRESSION: 1. Left anterior rib fractures 3-7, fourth through sixth fractures are displaced. Tiny associated left hemopneumothorax. Probable mild pulmonary contusion in the left lower lobe. 2. No additional acute traumatic injury to the chest, abdomen, or pelvis. 3. Small hiatal hernia, incidentally noted. Critical Value/emergent results were called by telephone at the time of interpretation on 05/15/2017 at 2:19 am to Dr. SEzequiel Essex, who verbally acknowledged these results. Electronically Signed   By: MJeb LeveringM.D.   On: 05/15/2017 02:19   Dg Pelvis Portable  Result Date: 05/15/2017 CLINICAL DATA:  Possible fall, level 2 trauma. EXAM: PORTABLE PELVIS 1-2 VIEWS COMPARISON:  None. FINDINGS: The cortical margins of the bony pelvis are intact. No fracture. Pubic symphysis and sacroiliac joints are congruent. Both femoral heads are well-seated in the respective acetabula. IMPRESSION: No evidence of pelvic fracture. Electronically Signed   By: MJeb LeveringM.D.   On: 05/15/2017 01:47   Dg Chest Port 1 View  Result Date: 05/15/2017 CLINICAL DATA:  Chest pain.  Possible fall.  Level 2 trauma. EXAM: PORTABLE CHEST 1 VIEW COMPARISON:  None. FINDINGS: Low lung volumes. Heart size is normal. No large pneumothorax, pleural effusion or focal airspace disease. No evidence of displaced rib fracture. IMPRESSION: Low lung volumes without evidence of acute traumatic injury. Chest CT is planned. Electronically Signed   By: Jeb Levering M.D.   On: 05/15/2017 01:46   Ct Maxillofacial Wo Contrast  Result Date: 05/15/2017 CLINICAL DATA:  Initial evaluation for acute trauma, fall. EXAM: CT HEAD WITHOUT CONTRAST CT MAXILLOFACIAL WITHOUT CONTRAST CT CERVICAL SPINE WITHOUT CONTRAST TECHNIQUE: Multidetector CT imaging of the head, cervical spine, and maxillofacial structures were performed using the standard protocol without intravenous contrast. Multiplanar CT image  reconstructions of the cervical spine and maxillofacial structures were also generated. COMPARISON:  None. FINDINGS: CT HEAD FINDINGS Brain: Cerebral volume normal. No acute intracranial hemorrhage. No evidence for acute large vessel territory infarct. No mass lesion, midline shift or mass effect. No hydrocephalus. Mild prominence of the extra-axial space overlying the left frontal convexity without discrete extra-axial fluid collection. Vascular: No asymmetric hyperdense vessel. Skull: Scalp soft tissues demonstrate no acute abnormality. Calvarium intact. Other: No mastoid effusion. CT MAXILLOFACIAL FINDINGS Osseous: Zygomatic arches intact. No acute maxillary fracture. Pterygoid plates intact. Nasal bones intact. Nasal septum bowed to the left but intact. Visualized mandible intact. Mandibular condyles normally situated. No acute abnormality about the dentition. Scattered dental caries noted. Orbits: Globes oral soft tissues within normal limits. Bony orbits intact. Sinuses: Clear. Soft tissues: Mild soft tissue swelling at the lateral left face. No other appreciable soft tissue injury. CT CERVICAL SPINE FINDINGS Alignment: Mild dextroscoliosis, which may in part be related positioning. Vertebral bodies otherwise normally aligned with preservation of the normal cervical lordosis. Skull base and vertebrae: Skullbase intact. Normal C1-2 articulations preserved. Dens is intact. Vertebral body heights maintained. No acute fracture. Soft tissues and spinal canal: Soft tissues of the neck demonstrate no acute abnormality. No prevertebral edema. Disc levels:  Mild degenerative spondylolysis noted at C5-6. Upper chest: Visualized upper chest demonstrates no acute abnormality. Visualized lung apices are clear. No apical pneumothorax. IMPRESSION: CT HEAD: No acute intracranial process identified. CT MAXILLOFACIAL: 1. Mild left facial soft tissue swelling/contusion. 2. No other acute maxillofacial injury identified.  No  fracture. CT CERVICAL SPINE: No acute traumatic injury within cervical spine. Electronically Signed   By: Jeannine Boga M.D.   On: 05/15/2017 02:37    Review of Systems  Constitutional: Negative for chills and fever.  HENT: Negative for hearing loss.   Eyes: Negative for blurred vision and double vision.  Respiratory: Negative for shortness of breath.   Cardiovascular: Positive for chest pain.  Gastrointestinal: Negative for abdominal pain, nausea and vomiting.  Genitourinary: Negative.   Musculoskeletal: Negative.   Skin: Negative.   Neurological: Negative for sensory change, speech change and loss of consciousness.  Endo/Heme/Allergies: Negative.   Psychiatric/Behavioral: Negative.     Blood pressure 117/82, pulse 93, temperature 98.1 F (36.7 C), resp. rate 17, height 6' 2"  (1.88 m), weight 90.7 kg (200 lb), SpO2 97 %. Physical Exam  Constitutional: He is oriented to person, place, and time. He appears well-developed and well-nourished. No distress.  HENT:  Head: Normocephalic. Head is with contusion.    Right Ear: Hearing, tympanic membrane, external ear and ear canal normal.  Left Ear: Hearing, tympanic membrane, external ear and ear canal normal.  Nose: No sinus tenderness.  Mouth/Throat: Uvula is midline and oropharynx is clear and moist.  Large contusion left face without deformity  Eyes: Conjunctivae and EOM are normal. Pupils  are equal, round, and reactive to light.  Neck:  No posterior midline tenderness, no pain on active range of motion, collar removed  Cardiovascular: Normal rate, regular rhythm, normal heart sounds and intact distal pulses.   Respiratory: Effort normal and breath sounds normal. No respiratory distress. He has no wheezes. He has no rales. He exhibits tenderness.  Left chest wall tenderness  GI: He exhibits no distension. There is no tenderness. There is no rebound and no guarding.  Musculoskeletal: Normal range of motion. He exhibits no  edema or deformity.  Neurological: He is alert and oriented to person, place, and time. He displays no atrophy and no tremor. He exhibits normal muscle tone. He displays no seizure activity.  MAE  Skin: Skin is warm.  Psychiatric: He has a normal mood and affect.     Assessment/Plan Fall Left rib fractures 3-7 with tiny occult pneumothorax - pulmonary toilet, pain control, chest x-ray in a.m. L face contusion ETOH - CSW for SBIRT He requests nicotine patch  Admit to floor  Rashel Okeefe E, MD 05/15/2017, 4:23 AM

## 2017-05-15 NOTE — ED Notes (Signed)
Assisted pt with urinal, falling asleep after receiving fentanyl. Asking for food when woken.

## 2017-05-15 NOTE — ED Notes (Signed)
The pt moaning loudly cursing c/o chest pain

## 2017-05-15 NOTE — Progress Notes (Addendum)
Subjective: Stable and alert.  Complains of left chest wall pain. Denies air hunger.  Denies nausea.  Denies abdominal pain. Mild left facial pain. SPO2 100% on room air.   Objective: Vital signs in last 24 hours: Temp:  [98.1 F (36.7 C)-98.9 F (37.2 C)] 98.9 F (37.2 C) (06/09 0534) Pulse Rate:  [80-100] 100 (06/09 0534) Resp:  [15-23] 20 (06/09 0534) BP: (108-148)/(78-92) 121/91 (06/09 0534) SpO2:  [92 %-100 %] 100 % (06/09 0534) Weight:  [82.1 kg (181 lb)-90.7 kg (200 lb)] 82.1 kg (181 lb) (06/09 0534) Last BM Date: 05/14/17  Intake/Output from previous day: 06/08 0701 - 06/09 0700 In: 240 [P.O.:240] Out: 600 [Urine:600] Intake/Output this shift: No intake/output data recorded.  General appearance: Alert.  Very flat affect.  Oriented.  Answers questions appropriately.  Does not appear to be in severe pain or respiratory distress. Head: Normocephalic, without obvious abnormality, atraumatic, Ecchymoses left face.  No evidence of mandible fracture.  No present close his mouth well.  Minimal swelling. Neck: no adenopathy, no carotid bruit, no JVD, supple, symmetrical, trachea midline, thyroid not enlarged, symmetric, no tenderness/mass/nodules and Neck with good range of motion.  No posterior tenderness.  No pain with active range of motion. Resp: Tender left lateral rib cage but no motion or instability.  Lung sounds clear. GI: soft, non-tender; bowel sounds normal; no masses,  no organomegaly Pelvic:  Pelvis stable and nontender to compress iliac wings and symphysis. Neurologic: Grossly normal.  Alert and oriented 4.  No gross motor or sensory deficits.  Moves all 470s to command  Lab Results:   Recent Labs  05/15/17 0130 05/15/17 0139 05/15/17 0540  WBC 19.2*  --  17.0*  HGB 16.0 16.3 15.9  HCT 46.8 48.0 45.9  PLT 233  --  240   BMET  Recent Labs  05/15/17 0130 05/15/17 0139 05/15/17 0540  NA 139 142  --   K 3.8 4.0  --   CL 108 107  --   CO2 19*  --    --   GLUCOSE 129* 131*  --   BUN 14 17  --   CREATININE 1.06 1.30* 0.95  CALCIUM 8.6*  --   --    PT/INR  Recent Labs  05/15/17 0130  LABPROT 13.1  INR 0.99   ABG No results for input(s): PHART, HCO3 in the last 72 hours.  Invalid input(s): PCO2, PO2  Studies/Results: Ct Head Wo Contrast  Result Date: 05/15/2017 CLINICAL DATA:  Initial evaluation for acute trauma, fall. EXAM: CT HEAD WITHOUT CONTRAST CT MAXILLOFACIAL WITHOUT CONTRAST CT CERVICAL SPINE WITHOUT CONTRAST TECHNIQUE: Multidetector CT imaging of the head, cervical spine, and maxillofacial structures were performed using the standard protocol without intravenous contrast. Multiplanar CT image reconstructions of the cervical spine and maxillofacial structures were also generated. COMPARISON:  None. FINDINGS: CT HEAD FINDINGS Brain: Cerebral volume normal. No acute intracranial hemorrhage. No evidence for acute large vessel territory infarct. No mass lesion, midline shift or mass effect. No hydrocephalus. Mild prominence of the extra-axial space overlying the left frontal convexity without discrete extra-axial fluid collection. Vascular: No asymmetric hyperdense vessel. Skull: Scalp soft tissues demonstrate no acute abnormality. Calvarium intact. Other: No mastoid effusion. CT MAXILLOFACIAL FINDINGS Osseous: Zygomatic arches intact. No acute maxillary fracture. Pterygoid plates intact. Nasal bones intact. Nasal septum bowed to the left but intact. Visualized mandible intact. Mandibular condyles normally situated. No acute abnormality about the dentition. Scattered dental caries noted. Orbits: Globes oral soft tissues within normal limits.  Bony orbits intact. Sinuses: Clear. Soft tissues: Mild soft tissue swelling at the lateral left face. No other appreciable soft tissue injury. CT CERVICAL SPINE FINDINGS Alignment: Mild dextroscoliosis, which may in part be related positioning. Vertebral bodies otherwise normally aligned with  preservation of the normal cervical lordosis. Skull base and vertebrae: Skullbase intact. Normal C1-2 articulations preserved. Dens is intact. Vertebral body heights maintained. No acute fracture. Soft tissues and spinal canal: Soft tissues of the neck demonstrate no acute abnormality. No prevertebral edema. Disc levels:  Mild degenerative spondylolysis noted at C5-6. Upper chest: Visualized upper chest demonstrates no acute abnormality. Visualized lung apices are clear. No apical pneumothorax. IMPRESSION: CT HEAD: No acute intracranial process identified. CT MAXILLOFACIAL: 1. Mild left facial soft tissue swelling/contusion. 2. No other acute maxillofacial injury identified.  No fracture. CT CERVICAL SPINE: No acute traumatic injury within cervical spine. Electronically Signed   By: Rise Mu M.D.   On: 05/15/2017 02:37   Ct Chest W Contrast  Result Date: 05/15/2017 CLINICAL DATA:  Left chest wall pain.  Level 2 trauma.  Fall. EXAM: CT CHEST, ABDOMEN, AND PELVIS WITH CONTRAST TECHNIQUE: Multidetector CT imaging of the chest, abdomen and pelvis was performed following the standard protocol during bolus administration of intravenous contrast. CONTRAST:  ISOVUE-300 IOPAMIDOL (ISOVUE-300) INJECTION 61% COMPARISON:  Chest and pelvic radiographs earlier this day. FINDINGS: CT CHEST FINDINGS Cardiovascular: No acute traumatic aortic injury. The heart is normal in size. No pericardial fluid. Mediastinum/Nodes: No pneumomediastinum. No mediastinal hematoma. No adenopathy. Visualized thyroid gland is normal. The esophagus is decompressed. Small hiatal hernia. Lungs/Pleura: Tiny left pneumothorax most prominent anteriorly. Patchy left lower lobe opacities may be atelectasis or mild contusion. Trace left hemothorax. Probable dependent atelectasis in the right lower lobe. Trachea and mainstem bronchi are patent. Minimal adherent mucus in the upper trachea. Musculoskeletal: Fractures of left anterior third  through seventh ribs, fourth through sixth rib fractures minimally displaced. Tiny adjacent extrapleural air and pneumothorax. Motion artifact partially obscures evaluation of lower most ribs. The sternum is intact. No fracture or subluxation of the thoracic spine. Visualize clavicles and shoulder girdles are intact. Soft tissue stranding of the left anterior lateral chest wall. CT ABDOMEN PELVIS FINDINGS Hepatobiliary: No hepatic injury or perihepatic hematoma. Gallbladder is unremarkable Pancreas: No ductal dilatation or inflammation. Spleen: No splenic injury or perisplenic hematoma. Adrenals/Urinary Tract: No adrenal hemorrhage or renal injury identified. Bladder is unremarkable. Stomach/Bowel: No evidence of bowel injury. No bowel wall thickening. No mesenteric hematoma. Normal appendix. Vascular/Lymphatic: No acute vascular injury. Abdominal aorta and IVC are intact. No retroperitoneal fluid. No adenopathy. Reproductive: Normal. Other: Tiny foci of extraperitoneal air in the left upper abdomen are likely tracking from rib fractures. No free fluid or ascites. Musculoskeletal: No fracture of the bony pelvis or lumbar spine. IMPRESSION: 1. Left anterior rib fractures 3-7, fourth through sixth fractures are displaced. Tiny associated left hemopneumothorax. Probable mild pulmonary contusion in the left lower lobe. 2. No additional acute traumatic injury to the chest, abdomen, or pelvis. 3. Small hiatal hernia, incidentally noted. Critical Value/emergent results were called by telephone at the time of interpretation on 05/15/2017 at 2:19 am to Dr. Glynn Octave , who verbally acknowledged these results. Electronically Signed   By: Rubye Oaks M.D.   On: 05/15/2017 02:19   Ct Cervical Spine Wo Contrast  Result Date: 05/15/2017 CLINICAL DATA:  Initial evaluation for acute trauma, fall. EXAM: CT HEAD WITHOUT CONTRAST CT MAXILLOFACIAL WITHOUT CONTRAST CT CERVICAL SPINE WITHOUT CONTRAST TECHNIQUE: Multidetector  CT imaging of the head, cervical spine, and maxillofacial structures were performed using the standard protocol without intravenous contrast. Multiplanar CT image reconstructions of the cervical spine and maxillofacial structures were also generated. COMPARISON:  None. FINDINGS: CT HEAD FINDINGS Brain: Cerebral volume normal. No acute intracranial hemorrhage. No evidence for acute large vessel territory infarct. No mass lesion, midline shift or mass effect. No hydrocephalus. Mild prominence of the extra-axial space overlying the left frontal convexity without discrete extra-axial fluid collection. Vascular: No asymmetric hyperdense vessel. Skull: Scalp soft tissues demonstrate no acute abnormality. Calvarium intact. Other: No mastoid effusion. CT MAXILLOFACIAL FINDINGS Osseous: Zygomatic arches intact. No acute maxillary fracture. Pterygoid plates intact. Nasal bones intact. Nasal septum bowed to the left but intact. Visualized mandible intact. Mandibular condyles normally situated. No acute abnormality about the dentition. Scattered dental caries noted. Orbits: Globes oral soft tissues within normal limits. Bony orbits intact. Sinuses: Clear. Soft tissues: Mild soft tissue swelling at the lateral left face. No other appreciable soft tissue injury. CT CERVICAL SPINE FINDINGS Alignment: Mild dextroscoliosis, which may in part be related positioning. Vertebral bodies otherwise normally aligned with preservation of the normal cervical lordosis. Skull base and vertebrae: Skullbase intact. Normal C1-2 articulations preserved. Dens is intact. Vertebral body heights maintained. No acute fracture. Soft tissues and spinal canal: Soft tissues of the neck demonstrate no acute abnormality. No prevertebral edema. Disc levels:  Mild degenerative spondylolysis noted at C5-6. Upper chest: Visualized upper chest demonstrates no acute abnormality. Visualized lung apices are clear. No apical pneumothorax. IMPRESSION: CT HEAD: No acute  intracranial process identified. CT MAXILLOFACIAL: 1. Mild left facial soft tissue swelling/contusion. 2. No other acute maxillofacial injury identified.  No fracture. CT CERVICAL SPINE: No acute traumatic injury within cervical spine. Electronically Signed   By: Rise Mu M.D.   On: 05/15/2017 02:37   Ct Abdomen Pelvis W Contrast  Result Date: 05/15/2017 CLINICAL DATA:  Left chest wall pain.  Level 2 trauma.  Fall. EXAM: CT CHEST, ABDOMEN, AND PELVIS WITH CONTRAST TECHNIQUE: Multidetector CT imaging of the chest, abdomen and pelvis was performed following the standard protocol during bolus administration of intravenous contrast. CONTRAST:  ISOVUE-300 IOPAMIDOL (ISOVUE-300) INJECTION 61% COMPARISON:  Chest and pelvic radiographs earlier this day. FINDINGS: CT CHEST FINDINGS Cardiovascular: No acute traumatic aortic injury. The heart is normal in size. No pericardial fluid. Mediastinum/Nodes: No pneumomediastinum. No mediastinal hematoma. No adenopathy. Visualized thyroid gland is normal. The esophagus is decompressed. Small hiatal hernia. Lungs/Pleura: Tiny left pneumothorax most prominent anteriorly. Patchy left lower lobe opacities may be atelectasis or mild contusion. Trace left hemothorax. Probable dependent atelectasis in the right lower lobe. Trachea and mainstem bronchi are patent. Minimal adherent mucus in the upper trachea. Musculoskeletal: Fractures of left anterior third through seventh ribs, fourth through sixth rib fractures minimally displaced. Tiny adjacent extrapleural air and pneumothorax. Motion artifact partially obscures evaluation of lower most ribs. The sternum is intact. No fracture or subluxation of the thoracic spine. Visualize clavicles and shoulder girdles are intact. Soft tissue stranding of the left anterior lateral chest wall. CT ABDOMEN PELVIS FINDINGS Hepatobiliary: No hepatic injury or perihepatic hematoma. Gallbladder is unremarkable Pancreas: No ductal  dilatation or inflammation. Spleen: No splenic injury or perisplenic hematoma. Adrenals/Urinary Tract: No adrenal hemorrhage or renal injury identified. Bladder is unremarkable. Stomach/Bowel: No evidence of bowel injury. No bowel wall thickening. No mesenteric hematoma. Normal appendix. Vascular/Lymphatic: No acute vascular injury. Abdominal aorta and IVC are intact. No retroperitoneal fluid. No adenopathy. Reproductive: Normal. Other:  Tiny foci of extraperitoneal air in the left upper abdomen are likely tracking from rib fractures. No free fluid or ascites. Musculoskeletal: No fracture of the bony pelvis or lumbar spine. IMPRESSION: 1. Left anterior rib fractures 3-7, fourth through sixth fractures are displaced. Tiny associated left hemopneumothorax. Probable mild pulmonary contusion in the left lower lobe. 2. No additional acute traumatic injury to the chest, abdomen, or pelvis. 3. Small hiatal hernia, incidentally noted. Critical Value/emergent results were called by telephone at the time of interpretation on 05/15/2017 at 2:19 am to Dr. Glynn Octave , who verbally acknowledged these results. Electronically Signed   By: Rubye Oaks M.D.   On: 05/15/2017 02:19   Dg Pelvis Portable  Result Date: 05/15/2017 CLINICAL DATA:  Possible fall, level 2 trauma. EXAM: PORTABLE PELVIS 1-2 VIEWS COMPARISON:  None. FINDINGS: The cortical margins of the bony pelvis are intact. No fracture. Pubic symphysis and sacroiliac joints are congruent. Both femoral heads are well-seated in the respective acetabula. IMPRESSION: No evidence of pelvic fracture. Electronically Signed   By: Rubye Oaks M.D.   On: 05/15/2017 01:47   Dg Chest Port 1 View  Result Date: 05/15/2017 CLINICAL DATA:  Chest pain.  Possible fall.  Level 2 trauma. EXAM: PORTABLE CHEST 1 VIEW COMPARISON:  None. FINDINGS: Low lung volumes. Heart size is normal. No large pneumothorax, pleural effusion or focal airspace disease. No evidence of displaced rib  fracture. IMPRESSION: Low lung volumes without evidence of acute traumatic injury. Chest CT is planned. Electronically Signed   By: Rubye Oaks M.D.   On: 05/15/2017 01:46   Ct Maxillofacial Wo Contrast  Result Date: 05/15/2017 CLINICAL DATA:  Initial evaluation for acute trauma, fall. EXAM: CT HEAD WITHOUT CONTRAST CT MAXILLOFACIAL WITHOUT CONTRAST CT CERVICAL SPINE WITHOUT CONTRAST TECHNIQUE: Multidetector CT imaging of the head, cervical spine, and maxillofacial structures were performed using the standard protocol without intravenous contrast. Multiplanar CT image reconstructions of the cervical spine and maxillofacial structures were also generated. COMPARISON:  None. FINDINGS: CT HEAD FINDINGS Brain: Cerebral volume normal. No acute intracranial hemorrhage. No evidence for acute large vessel territory infarct. No mass lesion, midline shift or mass effect. No hydrocephalus. Mild prominence of the extra-axial space overlying the left frontal convexity without discrete extra-axial fluid collection. Vascular: No asymmetric hyperdense vessel. Skull: Scalp soft tissues demonstrate no acute abnormality. Calvarium intact. Other: No mastoid effusion. CT MAXILLOFACIAL FINDINGS Osseous: Zygomatic arches intact. No acute maxillary fracture. Pterygoid plates intact. Nasal bones intact. Nasal septum bowed to the left but intact. Visualized mandible intact. Mandibular condyles normally situated. No acute abnormality about the dentition. Scattered dental caries noted. Orbits: Globes oral soft tissues within normal limits. Bony orbits intact. Sinuses: Clear. Soft tissues: Mild soft tissue swelling at the lateral left face. No other appreciable soft tissue injury. CT CERVICAL SPINE FINDINGS Alignment: Mild dextroscoliosis, which may in part be related positioning. Vertebral bodies otherwise normally aligned with preservation of the normal cervical lordosis. Skull base and vertebrae: Skullbase intact. Normal C1-2  articulations preserved. Dens is intact. Vertebral body heights maintained. No acute fracture. Soft tissues and spinal canal: Soft tissues of the neck demonstrate no acute abnormality. No prevertebral edema. Disc levels:  Mild degenerative spondylolysis noted at C5-6. Upper chest: Visualized upper chest demonstrates no acute abnormality. Visualized lung apices are clear. No apical pneumothorax. IMPRESSION: CT HEAD: No acute intracranial process identified. CT MAXILLOFACIAL: 1. Mild left facial soft tissue swelling/contusion. 2. No other acute maxillofacial injury identified.  No fracture. CT CERVICAL SPINE:  No acute traumatic injury within cervical spine. Electronically Signed   By: Rise Mu M.D.   On: 05/15/2017 02:37    Anti-infectives: Anti-infectives    None      Assessment/Plan:  Fall Left rib fractures 3 through 7 with tiny occult pneumothorax. - Incentive spirometry.  Pain control.  Chest x-ray      Sunday, tomorrow.  Mobilize Left facial contusion - no evidence of bony injury or parotid injury EtOH - alcohol level 292 on admission.  

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
## 2017-05-15 NOTE — ED Notes (Signed)
Attempted report 

## 2017-05-15 NOTE — Progress Notes (Signed)
   05/15/17 0800  Clinical Encounter Type  Visited With Health care provider  Visit Type Initial;Trauma  Spiritual Encounters  Spiritual Needs Emotional   Chaplain was called to a level 2.    Chaplain offered emotional support and offered to reach out to the Pt's loved one. Pt decline, however, if Pt changes his mind, please page the on-call Chaplain   Thanks, Corwin LevinsBeatrice Jeny Nield

## 2017-05-16 ENCOUNTER — Inpatient Hospital Stay (HOSPITAL_COMMUNITY): Payer: Self-pay

## 2017-05-16 LAB — CBC
HCT: 44.6 % (ref 39.0–52.0)
Hemoglobin: 15.2 g/dL (ref 13.0–17.0)
MCH: 29 pg (ref 26.0–34.0)
MCHC: 34.1 g/dL (ref 30.0–36.0)
MCV: 85 fL (ref 78.0–100.0)
Platelets: 217 10*3/uL (ref 150–400)
RBC: 5.25 MIL/uL (ref 4.22–5.81)
RDW: 13 % (ref 11.5–15.5)
WBC: 17.4 10*3/uL — ABNORMAL HIGH (ref 4.0–10.5)

## 2017-05-16 MED ORDER — SENNOSIDES-DOCUSATE SODIUM 8.6-50 MG PO TABS
2.0000 | ORAL_TABLET | Freq: Two times a day (BID) | ORAL | Status: DC
  Administered 2017-05-16 – 2017-05-17 (×3): 2 via ORAL
  Filled 2017-05-16 (×2): qty 2

## 2017-05-16 MED ORDER — IPRATROPIUM-ALBUTEROL 0.5-2.5 (3) MG/3ML IN SOLN
3.0000 mL | Freq: Three times a day (TID) | RESPIRATORY_TRACT | Status: DC
  Administered 2017-05-16 – 2017-05-18 (×5): 3 mL via RESPIRATORY_TRACT
  Filled 2017-05-16 (×7): qty 3

## 2017-05-16 MED ORDER — LACTATED RINGERS IV SOLN
INTRAVENOUS | Status: DC
  Administered 2017-05-16: 09:00:00 via INTRAVENOUS
  Filled 2017-05-16 (×2): qty 1000

## 2017-05-16 MED ORDER — GUAIFENESIN ER 600 MG PO TB12
1200.0000 mg | ORAL_TABLET | Freq: Two times a day (BID) | ORAL | Status: DC
  Administered 2017-05-16 – 2017-05-18 (×5): 1200 mg via ORAL
  Filled 2017-05-16 (×5): qty 2

## 2017-05-16 MED ORDER — IPRATROPIUM-ALBUTEROL 0.5-2.5 (3) MG/3ML IN SOLN
3.0000 mL | Freq: Four times a day (QID) | RESPIRATORY_TRACT | Status: DC
  Administered 2017-05-16: 3 mL via RESPIRATORY_TRACT
  Filled 2017-05-16: qty 3

## 2017-05-16 NOTE — Progress Notes (Signed)
Patient complained of having really bad chest pain and trouble breathing. He stated "it feels like I have fluid in my chest when I cough". O2 sat read 96 on 1L [place]. Increased to 2L. Assessed lungs and heard a crackle in left upper lung otherwise lung sounds clear. MD Conner notified of symptoms and made aware. Ordered to encourage using incentive spirometry more and will follow up in am with chest x-ray. Will continue to monitor patient.

## 2017-05-16 NOTE — Progress Notes (Signed)
Pt has ambulated in the hall with good toleration multiple times today.  Encouraged him to stop smoking post discharge.  With good pain control, pt is able to move, cough and deep breathe.

## 2017-05-16 NOTE — Progress Notes (Signed)
Patient O2 Sat decreased to 84% on 2L [place]. Reassessed patients O2 and reading was only at 85%. Increased O2 to 4L  and then up to 5L. Baseline at 87% on 5L. MD paged. Will continue to monitor.

## 2017-05-16 NOTE — Progress Notes (Signed)
MD Conner responded to page. Waiting on results from patients chest xray. Will continue to monitor patient.
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
## 2017-05-17 ENCOUNTER — Inpatient Hospital Stay (HOSPITAL_COMMUNITY): Payer: Self-pay

## 2017-05-17 MED ORDER — KETOROLAC TROMETHAMINE 15 MG/ML IJ SOLN
15.0000 mg | Freq: Four times a day (QID) | INTRAMUSCULAR | Status: DC
  Administered 2017-05-17 – 2017-05-18 (×5): 15 mg via INTRAVENOUS
  Filled 2017-05-17 (×5): qty 1

## 2017-05-17 MED ORDER — ACETAMINOPHEN 500 MG PO TABS
1000.0000 mg | ORAL_TABLET | Freq: Three times a day (TID) | ORAL | Status: DC
  Administered 2017-05-17 – 2017-05-18 (×4): 1000 mg via ORAL
  Filled 2017-05-17 (×4): qty 2

## 2017-05-17 MED ORDER — METHOCARBAMOL 750 MG PO TABS
750.0000 mg | ORAL_TABLET | Freq: Three times a day (TID) | ORAL | Status: DC
  Administered 2017-05-17 – 2017-05-18 (×4): 750 mg via ORAL
  Filled 2017-05-17 (×4): qty 1

## 2017-05-17 MED ORDER — OXYCODONE HCL 5 MG PO TABS
10.0000 mg | ORAL_TABLET | ORAL | Status: DC | PRN
  Administered 2017-05-17 – 2017-05-18 (×4): 15 mg via ORAL
  Administered 2017-05-18: 10 mg via ORAL
  Administered 2017-05-18: 15 mg via ORAL
  Filled 2017-05-17 (×5): qty 3
  Filled 2017-05-17: qty 2

## 2017-05-17 MED ORDER — KETOROLAC TROMETHAMINE 30 MG/ML IJ SOLN
30.0000 mg | Freq: Once | INTRAMUSCULAR | Status: AC
  Administered 2017-05-17: 30 mg via INTRAVENOUS
  Filled 2017-05-17: qty 1

## 2017-05-17 MED ORDER — WHITE PETROLATUM GEL
Status: AC
  Filled 2017-05-17: qty 1
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
## 2017-05-18 ENCOUNTER — Inpatient Hospital Stay (HOSPITAL_COMMUNITY): Payer: Self-pay

## 2017-05-18 ENCOUNTER — Encounter (HOSPITAL_COMMUNITY): Payer: Self-pay

## 2017-05-18 DIAGNOSIS — W19XXXA Unspecified fall, initial encounter: Secondary | ICD-10-CM

## 2017-05-18 HISTORY — DX: Unspecified fall, initial encounter: W19.XXXA

## 2017-05-18 MED ORDER — OXYCODONE HCL 10 MG PO TABS: 10.0000 mg | ORAL_TABLET | ORAL | 0 refills | Status: DC | PRN

## 2017-05-18 MED ORDER — METHOCARBAMOL 750 MG PO TABS: 750.0000 mg | ORAL_TABLET | Freq: Three times a day (TID) | ORAL | 1 refills | Status: DC
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
## 2017-05-18 NOTE — Progress Notes (Signed)
[facility]/Tra[facility] Progress Note      Subjective: 36 y.o. Male post admission day 3 for fall. Pt pain is much improved today, using IS consistently throughout the day and pulling 1000 with each attempt. His cough has improved and he isn't feeling as SOB, ambulating regularly yesterday with minimal discomfort. He had some abdominal discomfort yesterday, but better this morning. Pt has not had a BM, appetite is good, slept great last night. Pt denies HA, visual/hearning changes, N/V, urinary difficulties, new numbness/tingling/edema.   Objective: Vital signs in last 24 hours: Temp:  [98.4 F (36.9 C)-99.7 F (37.6 C)] 98.4 F (36.9 C) (06/12 0519) Pulse Rate:  [74-77] 77 (06/12 0519) Resp:  [18-19] 19 (06/12 0519) BP: (106-125)/(68-76) 106/72 (06/12 0519) SpO2:  [96 %-99 %] 98 % (06/12 0519) Last BM Date: 05/14/17  Intake/Output from previous day: 06/11 0701 - 06/12 0700 In: 865 [P.O.:865] Out: 50 [Urine:50] Intake/Output this shift: No intake/output data recorded.  PE: Physical Exam  Constitutional: He is oriented to person, place, and time. Vital signs are normal. He appears well-developed and well-nourished. No distress.  HENT:  Head: Normocephalic.  Mouth/Throat: Uvula is midline, oropharynx is clear and moist and mucous membranes are normal.  Eyes: Conjunctivae and lids are normal. Pupils are equal, round, and reactive to light.  Neck: Normal range of motion and phonation normal.  Cardiovascular: Normal rate, regular rhythm and normal heart sounds.   No murmur heard. Pulses:      Radial pulses are 2+ on the right side, and 2+ on the left side.       Posterior tibial pulses are 2+ on the right side, and 2+ on the left side.  Pulmonary/Chest: Effort normal. No respiratory distress.  Mild crackles heard at base of lungs  Abdominal: Soft. Bowel sounds are normal. He exhibits no distension. There is no tenderness. There is no guarding.  Musculoskeletal:  No  gross abnormalities noted.   Neurological: He is alert and oriented to person, place, and time.  Skin: Skin is warm and dry.  Psychiatric: He has a normal mood and affect.  Nursing note and vitals reviewed.   Lab Results:   Recent Labs  05/16/17 0427  WBC 17.4*  HGB 15.2  HCT 44.6  PLT 217   BMET No results for input(s): NA, K, CL, CO2, GLUCOSE, BUN, CREATININE, CALCIUM in the last 72 hours. PT/INR No results for input(s): LABPROT, INR in the last 72 hours. CMP     Component Value Date/Time   NA 142 05/15/2017 0139   K 4.0 05/15/2017 0139   CL 107 05/15/2017 0139   CO2 19 (L) 05/15/2017 0130   GLUCOSE 131 (H) 05/15/2017 0139   BUN 17 05/15/2017 0139   CREATININE 0.95 05/15/2017 0540   CALCIUM 8.6 (L) 05/15/2017 0130   PROT 7.5 05/15/2017 0130   ALBUMIN 4.5 05/15/2017 0130   AST 30 05/15/2017 0130   ALT 18 05/15/2017 0130   ALKPHOS 67 05/15/2017 0130   BILITOT 0.5 05/15/2017 0130   GFRNONAA >60 05/15/2017 0540   GFRAA >60 05/15/2017 0540   Lipase  No results found for: LIPASE  Studies/Results: Dg Chest 2 View  Result Date: 05/17/2017 CLINICAL DATA:  Multiple rib fractures. EXAM: CHEST  2 VIEW COMPARISON:  May 16, 2017 chest radiograph and chest CT May 15, 2017 FINDINGS: There is persistent patchy consolidation in the left base with small left pleural effusion. Lungs elsewhere clear. Heart is upper normal in size with pulmonary vascularity within  normal limits. No adenopathy. No pneumothorax evident. The known rib fractures on the left seen on CT are not well seen radiographically. IMPRESSION: Patchy consolidation left base with small left pleural effusion. Lungs elsewhere clear. Stable cardiac silhouette. No pneumothorax. Known rib fractures not well seen. Electronically Signed   By: Bretta Bang III M.D.   On: 05/17/2017 07:04   Dg Chest Port 1 View  Result Date: 05/18/2017 CLINICAL DATA:  Multiple rib fractures EXAM: PORTABLE CHEST 1 VIEW COMPARISON:   05/17/2017 FINDINGS: Bibasilar airspace disease left greater than right. Bilateral pleural effusions left greater than right. Left basilar lateral rib fractures are mildly displaced and stable. No ensuing pneumothorax. IMPRESSION: Stable bilateral pleural effusions and bibasilar atelectasis left greater than right. Left rib fractures.  No pneumothorax pre Electronically Signed   By: Jolaine Click M.D.   On: 05/18/2017 07:31    Anti-infectives: Anti-infectives    None       Assessment/Plan Fall Left rib fractures 3 through 7 with tiny occult pneumothorax.  - Increasing density left base consistent with contusion and/or consolidation  - Continue regular use of Incentive spirometry and ambluation  - Analgesia: Oxycodone 10-15mg  scale, Toradol, robaxin   - Per PT/OT: no further w/u or recommendations required.  - Continue Expectorant and DuoNeb regimen - Today's XR: Stable bilateral pleural effusions and bibasilar atelectasis left greater than right. No pneumothorax Left facial contusion - no evidence of bony injury or parotid injury, swelling is reduced. EtOH - alcohol level 292 on admission.  - CSW for SBIRT Tobacco Use: nicoderm  FEN: Regular  VTE: Lovenox, SCDs ID: none   DISPO: Girlfriend arriving tomorrow evening, suppose to be here today but was held up in [place]. Possibly home today.     LOS: 3 days    Vanice Sarah , PA-S [facility] 05/18/2017, 7:50 AM Pager: [phone] Consults: [phone] Mon-Fri 7:00 am-4:30 pm Sat-Sun 7:00 am-11:30 am

## 2017-05-18 NOTE — Progress Notes (Signed)
Patient discharged to home with instructions and prescriptions. 

## 2017-05-18 NOTE — Discharge Instructions (Signed)
1. PAIN CONTROL:  1. Pain is best controlled by a usual combination of three different methods TOGETHER:  1. Ice/Heat 2. Over the counter pain medication 3. Prescription pain medication 2. Most patients will experience some swelling and bruising around wounds. Ice packs or heating pads (30-60 minutes up to 6 times a day) will help. Use ice for the first few days to help decrease swelling and bruising, then switch to heat to help relax tight/sore spots and speed recovery. Some people prefer to use ice alone, heat alone, alternating between ice & heat. Experiment to what works for you. Swelling and bruising can take several weeks to resolve.  3. It is helpful to take an over-the-counter pain medication regularly for the first few weeks. Choose one of the following that works best for you:  1. Naproxen (Aleve, etc) Two 220mg  tabs twice a day 2. Ibuprofen (Advil, etc) Three 200mg  tabs four times a day (every meal & bedtime) 3. Acetaminophen (Tylenol, etc) 500-650mg  four times a day (every meal & bedtime) 4. A prescription for pain medication (such as oxycodone, hydrocodone, etc) should be given to you upon discharge. Take your pain medication as prescribed.  1. If you are having problems/concerns with the prescription medicine (does not control pain, nausea, vomiting, rash, itching, etc), please call us [phone] to see if we need to switch you to a different pain medicine that will work better for you and/or control your side effect better. 2. If you need a refill on your pain medication, please contact your pharmacy. They will contact our office to request authorization. Prescriptions will not be filled after 5 pm or on week-ends. 4. Avoid getting constipated. When taking pain medications, it is common to experience some constipation. Increasing fluid intake and taking a fiber supplement (such as Metamucil, Citrucel, FiberCon, MiraLax, etc) 1-2 times a day regularly will usually help prevent this  problem from occurring. A mild laxative (prune juice, Milk of Magnesia, MiraLax, etc) should be taken according to package directions if there are no bowel movements after 48 hours.  5. Watch out for diarrhea. If you have many loose bowel movements, simplify your diet to bland foods & liquids for a few days. Stop any stool softeners and decrease your fiber supplement. Switching to mild anti-diarrheal medications (Kayopectate, Pepto Bismol) can help. If this worsens or does not improve, please call us.   WHEN TO CALL US [phone]:  1. Poor pain control 2. Reactions / problems with new medications (rash/itching, nausea, etc)  3. Fever over 101.5 F (38.5 C) 4. Worsening swelling or bruising 5. Increased pain 6. Shortness of breath  The clinic staff is available to answer your questions during regular business hours (8:30am-5pm). Please dont hesitate to call and ask to speak to one of our nurses for clinical concerns.  If you have a medical emergency, go to the nearest emergency room or call 911.  A surgeon from [facility] is always on call at the [facility]   [facility], [place]  [address], [address], [place], [place] [address] ?  MAIN: [phone] ? TOLL FREE: [phone] ?  FAX [phone]  [email or web address]    Rib Fracture A rib fracture is a break or crack in one of the bones of the ribs. The ribs are a group of long, curved bones that wrap around your chest and attach to your spine. They protect your lungs and other organs in the chest cavity. A broken or cracked  rib is often painful, but most do not cause other problems. Most rib fractures heal on their own over time. However, rib fractures can be more serious if multiple ribs are broken or if broken ribs move out of place and push against other structures. What are the causes?  A direct blow to the chest. For example, this could happen during contact sports, a car accident, or  a fall against a hard object.  Repetitive movements with high force, such as pitching a baseball or having severe coughing spells. What are the signs or symptoms?  Pain when you breathe in or cough.  Pain when someone presses on the injured area. How is this diagnosed? Your caregiver will perform a physical exam. Various imaging tests may be ordered to confirm the diagnosis and to look for related injuries. These tests may include a chest X-ray, computed tomography (CT), magnetic resonance imaging (MRI), or a bone scan. How is this treated? Rib fractures usually heal on their own in 1-3 months. The longer healing period is often associated with a continued cough or other aggravating activities. During the healing period, pain control is very important. Medication is usually given to control pain. Hospitalization or surgery may be needed for more severe injuries, such as those in which multiple ribs are broken or the ribs have moved out of place. Follow these instructions at home:  Avoid strenuous activity and any activities or movements that cause pain. Be careful during activities and avoid bumping the injured rib.  Gradually increase activity as directed by your caregiver.  Only take over-the-counter or prescription medications as directed by your caregiver. Do not take other medications without asking your caregiver first.  Apply ice to the injured area for the first 1-2 days after you have been treated or as directed by your caregiver. Applying ice helps to reduce inflammation and pain. ? Put ice in a plastic bag. ? Place a towel between your skin and the bag. ? Leave the ice on for 15-20 minutes at a time, every 2 hours while you are awake.  Perform deep breathing as directed by your caregiver. This will help prevent pneumonia, which is a common complication of a broken rib. Your caregiver may instruct you to: ? Take deep breaths several times a day. ? Try to cough several times a  day, holding a pillow against the injured area. ? Use a device called an incentive spirometer to practice deep breathing several times a day.  Drink enough fluids to keep your urine clear or pale yellow. This will help you avoid constipation.  Do not wear a rib belt or binder. These restrict breathing, which can lead to pneumonia. Get help right away if:  You have a fever.  You have difficulty breathing or shortness of breath.  You develop a continual cough, or you cough up thick or bloody sputum.  You feel sick to your stomach (nausea), throw up (vomit), or have abdominal pain.  You have worsening pain not controlled with medications. This information is not intended to replace advice given to you by your health care provider. Make sure you discuss any questions you have with your health care provider. Document Released: 11/23/2005 Document Revised: 04/30/2016 Document Reviewed: 01/25/2013 Elsevier Interactive Patient Education  2018 [employer].   Incentive Spirometer An incentive spirometer is a tool that measures how well you are filling your lungs with each breath. This tool can help keep your lungs clear and active. Taking long, deep breaths may help  reverse or decrease the chance of developing breathing (pulmonary) problems, especially infection, following:  Surgery of the chest or abdomen.  Surgery if you have a history of smoking or a lung problem.  A long period of time when you are unable to move or be active.  If the spirometer includes an indicator to show your best effort, your health care provider or respiratory therapist will help you set a goal. Keep a log of your progress if directed by your health care provider. What are the risks?  Breathing too quickly may cause dizziness or cause you to pass out. Take your time so you do not get dizzy or lightheaded.  If you are in pain, you may need to take or ask for pain medicine before doing incentive spirometry. It is  harder to take a deep breath if you are having pain. How to use your incentive spirometer 1. Sit on the edge of your bed if possible, or sit up as far as you can in bed or on a chair. 2. Hold the incentive spirometer in an upright position. 3. Breathe out normally. 4. Place the mouthpiece in your mouth and seal your lips tightly around it. 5. Breathe in slowly and as deeply as possible, raising the piston or the ball toward the top of the column. 6. Hold your breath for 3-5 seconds or for as long as possible. Allow the piston or ball to fall to the bottom of the column. 7. Remove the mouthpiece from your mouth and breathe out normally. 8. The spirometer may include an indicator to show your best effort. Use the indicator as a goal to work toward during each repetition. 9. Rest for a few seconds and repeat this at least 10 times, every 1-2 hours when you are awake. Take your time and take a few normal breaths between deep breaths. Breathing too quickly may cause dizziness or cause you to pass out. Take your time so you do not get dizzy or lightheaded. 10. After each set of 10 deep breaths, practice coughing to be sure your lungs are clear. If you had a surgical cut (incision) made during surgery, support your incision when coughing by placing a pillow or rolled-up towel firmly against it. Once you are able to get out of bed, walk around indoors and cough well. You may stop using the incentive spirometer when instructed by your health care provider. Contact a health care provider if:  You are having difficulty using the spirometer.  You have trouble using the spirometer as often as instructed.  Your pain medicine is not giving enough relief while using the spirometer.  You have a fever.  You develop shortness of breath. Get help right away if:  You develop a cough with bloody sputum.  You develop worsening pain, redness, or discharge at or near the incision site. This information is not  intended to replace advice given to you by your health care provider. Make sure you discuss any questions you have with your health care provider. Document Released: 04/05/2007 Document Revised: 08/17/2016 Document Reviewed: 07/02/2014 Elsevier Interactive Patient Education  [employer].

## 2017-05-18 NOTE — Discharge Summary (Signed)
[facility]/Trauma Discharge Summary   Patient ID: Douglas Daniels MRN: [id number] DOB/AGE: 1981/03/08 35 y.o.  Admit date: 05/15/2017 Discharge date: 05/18/2017  Admitting Diagnosis: - Fall - Left rib fractures 3-7 with tiny occult pneumothorax  - L face contusion  - ETOH intoxication    Discharge Diagnosis Patient Active Problem List   Diagnosis Date Noted  . Fall 05/18/2017  . Multiple fractures of ribs, left side, initial encounter for closed fracture 05/15/2017   Imaging: Dg Chest 2 View  Result Date: 05/17/2017 CLINICAL DATA:  Multiple rib fractures. EXAM: CHEST  2 VIEW COMPARISON:  May 16, 2017 chest radiograph and chest CT May 15, 2017 FINDINGS: There is persistent patchy consolidation in the left base with small left pleural effusion. Lungs elsewhere clear. Heart is upper normal in size with pulmonary vascularity within normal limits. No adenopathy. No pneumothorax evident. The known rib fractures on the left seen on CT are not well seen radiographically. IMPRESSION: Patchy consolidation left base with small left pleural effusion. Lungs elsewhere clear. Stable cardiac silhouette. No pneumothorax. Known rib fractures not well seen. Electronically Signed   By: Bretta Bang III M.D.   On: 05/17/2017 07:04   Dg Chest Port 1 View  Result Date: 05/18/2017 CLINICAL DATA:  Multiple rib fractures EXAM: PORTABLE CHEST 1 VIEW COMPARISON:  05/17/2017 FINDINGS: Bibasilar airspace disease left greater than right. Bilateral pleural effusions left greater than right. Left basilar lateral rib fractures are mildly displaced and stable. No ensuing pneumothorax. IMPRESSION: Stable bilateral pleural effusions and bibasilar atelectasis left greater than right. Left rib fractures.  No pneumothorax pre Electronically Signed   By: Jolaine Click M.D.   On: 05/18/2017 07:31   HPI: 36 y.o. Male brought in by EMS as a level 2 trauma after being found down outside a bar downtown.  He had been  drinking and had fallen on his L side. Pt reported sudden-onset left sided chest wall pain s/p fall PTA, associated abrasions to left sided face/left shoulder/bridge of nose and left upper abdominal pain. He was complaining of L-sided pain exacerbated by deep breaths. Workup in the ED demonstrated L-sided rib fxs 3-7 with tiny occult PTX.  Hospital Course:  Workup showed Left rib fractures 3-7 with tiny occult pneumothorax, pain was difficult to manage initially but significantly improved with the addition of robaxin and Toradol. IS use and ambluation was encouraged, repeat CXR was ordered. Repeat CXR showed resolved pneumothorax and b/l pleural effusions and bibasilar atelectasis worse on left, IS use was further encouraged, addition of expectorant and duoneb improved pt's cough and discomfrot. Maxillofacial CT showed Mild left facial soft tissue swelling/contusion, no parotid injury or fracture. PT/OT did not recommend f/u as pt was ambulating well. CSW was consulted for ETOH intoxication. On 05/18/17 ambulating well, pain well controlled, vital signs stable, and felt stable for discharge home, a friend is going to pick pt up and his girlfriend will be in town 6/13 to assist him. Patient will follow up in our office in 2 weeks and knows to call with questions or concerns. He will call to confirm appointment date/time.    Patient was discharged in good condition.  The [place] Substance controlled database was reviewed prior to prescribing narcotic pain medication to this patient.   Allergies as of 05/18/2017   No Known Allergies     Medication List    TAKE these medications   methocarbamol 750 MG tablet Commonly known as:  ROBAXIN Take 1 tablet (750 mg total)  by mouth 3 (three) times daily.   Oxycodone HCl 10 MG Tabs Take 1 tablet (10 mg total) by mouth every 4 (four) hours as needed.      Follow-up Information    [facility] Follow up.   Specialty:  Family  Medicine Why:  scheduled appt for 06/07/2017 at 2:30 pm , please call to reschedule if you cannot make appointment  Contact information: [address] [facility] [place] [phone]       [facility]. Schedule an appointment as soon as possible for a visit in 2 week(s).   Why:  for follow up.  Contact information: [address] [place] [phone]          Signed: Vanice SarahLauren Esthela Brandner PA-S [facility] 05/18/2017, 2:10 PM Pager: [phone] Consults: [phone] Mon-Fri 7:00 am-4:30 pm Sat-Sun 7:00 am-11:30 am

## 2017-05-18 NOTE — Care Management Note (Signed)
Case Management Note  Patient Details  Name: Douglas Daniels MRN: [id number] Date of Birth: 1981/09/26  Subjective/Objective:  Mechanical fall, fx ribs on Left 3-7                  Action/Plan: Discharge Planning: NCM spoke to pt and no insurance coverage. Hospital follow up appt scheduled for July 2, at 2:30 [facility]. Provided pt with brochure. States he works and would be able to afford medications. Will need note for work with return date.    Expected Discharge Date:  05/16/17               Expected Discharge Plan:  Home/Self Care  In-House Referral: Financial Counselor  Discharge planning Services  CM Consult, Follow-up appt scheduled  Post Acute Care Choice:  NA Choice offered to:  NA  DME Arranged:  N/A DME Agency:  NA  HH Arranged:  NA HH Agency:  NA  Status of Service:  Completed, signed off  If discussed at Long Length of Stay Meetings, dates discussed:    Additional Comments:  Elliot CousinShavis, Toneisha Savary Ellen, RN 05/18/2017, 11:15 AM

## 2017-05-24 ENCOUNTER — Emergency Department (HOSPITAL_COMMUNITY)
Admission: EM | Admit: 2017-05-24 | Discharge: 2017-05-24 | Disposition: A | Discharge status: Home / Self Care | Payer: Self-pay | Attending: Emergency Medicine | Admitting: Emergency Medicine

## 2017-05-24 ENCOUNTER — Encounter (HOSPITAL_COMMUNITY): Payer: Self-pay

## 2017-05-24 DIAGNOSIS — F17210 Nicotine dependence, cigarettes, uncomplicated: Secondary | ICD-10-CM | POA: Insufficient documentation

## 2017-05-24 DIAGNOSIS — I808 Phlebitis and thrombophlebitis of other sites: Secondary | ICD-10-CM | POA: Insufficient documentation

## 2017-05-24 MED ORDER — IBUPROFEN 600 MG PO TABS: 600.0000 mg | ORAL_TABLET | Freq: Four times a day (QID) | ORAL | 0 refills | Status: DC | PRN

## 2017-05-24 MED ORDER — IBUPROFEN 400 MG PO TABS
600.0000 mg | ORAL_TABLET | Freq: Once | ORAL | Status: AC
  Administered 2017-05-24: 05:00:00 600 mg via ORAL
  Filled 2017-05-24: qty 1

## 2017-05-24 NOTE — ED Provider Notes (Signed)
[facility]-EMERGENCY DEPT Provider Note   CSN: [id number] Arrival date & time: 05/24/17  0104     History   Chief Complaint Chief Complaint  Patient presents with  . Arm Pain    HPI Douglas Daniels is a 36 y.o. male.  Patient presents with complaint of pain and swelling to left arm. He was admitted on 05/15/17 (D/ch 05/18/17) with rib fractures and the area of pain is the IV site for the duration of that admission. Pain started on post-discharge day 2 and has not improved for the past several days. No fever. No numbness or weakness of the extremity.   The history is provided by the patient. No language interpreter was used.  Arm Pain  Pertinent negatives include no chest pain and no shortness of breath.    Past Medical History:  Diagnosis Date  . Fall 05/18/2017    Patient Active Problem List   Diagnosis Date Noted  . Fall 05/18/2017  . Multiple fractures of ribs, left side, initial encounter for closed fracture 05/15/2017    History reviewed. No pertinent surgical history.     Home Medications    Prior to Admission medications   Medication Sig Start Date End Date Taking? Authorizing Provider  ibuprofen (ADVIL,MOTRIN) 600 MG tablet Take 1 tablet (600 mg total) by mouth every 6 (six) hours as needed. 05/24/17   Elpidio AnisUpstill, Donyell Carrell, PA-C  methocarbamol (ROBAXIN) 750 MG tablet Take 1 tablet (750 mg total) by mouth 3 (three) times daily. 05/18/17   Focht, Joyce CopaJessica L, PA  Oxycodone HCl 10 MG TABS Take 1 tablet (10 mg total) by mouth every 4 (four) hours as needed. 05/18/17   Jerre SimonFocht, Jessica L, PA    Family History History reviewed. No pertinent family history.  Social History Social History  Substance Use Topics  . Smoking status: Current Every Day Smoker    Packs/day: 1.00    Types: Cigarettes  . Smokeless tobacco: Never Used  . Alcohol use Yes     Allergies   Patient has no known allergies.   Review of Systems Review of Systems  Constitutional: Negative for fever.    Respiratory: Negative for shortness of breath.   Cardiovascular: Negative for chest pain.  Gastrointestinal: Negative.  Negative for nausea.  Musculoskeletal:       See HPI  Skin: Positive for color change. Negative for wound.  Neurological: Negative.  Negative for weakness and numbness.     Physical Exam Updated Vital Signs BP (!) 136/91 (BP Location: Right Arm)   Pulse 60   Temp 98.3 F (36.8 C) (Oral)   Resp 18   SpO2 98%   Physical Exam  Constitutional: He is oriented to person, place, and time. He appears well-developed and well-nourished.  Neck: Normal range of motion.  Pulmonary/Chest: Effort normal.  Musculoskeletal: Normal range of motion.  Left AC arm is moderately swollen surrounding the IV insertion point. There is a palpable cord like swelling extending into the upper arm.   Neurological: He is alert and oriented to person, place, and time.  Skin: Skin is warm and dry.  Psychiatric: He has a normal mood and affect.     ED Treatments / Results  Labs (all labs ordered are listed, but only abnormal results are displayed) Labs Reviewed - No data to display  EKG  EKG Interpretation None       Radiology No results found.  Procedures Procedures (including critical care time)  Medications Ordered in ED Medications  ibuprofen (ADVIL,MOTRIN) tablet 600  mg (not administered)     Initial Impression / Assessment and Plan / ED Course  I have reviewed the triage vital signs and the nursing notes.  Pertinent labs & imaging results that were available during my care of the patient were reviewed by me and considered in my medical decision making (see chart for details).     Here with left arm pain associated with the site of IV on previous admission with exam c/w uncomplicated superficial thrombophlebitis.   Final Clinical Impressions(s) / ED Diagnoses   Final diagnoses:  Thrombophlebitis arm    New Prescriptions New Prescriptions   IBUPROFEN  (ADVIL,MOTRIN) 600 MG TABLET    Take 1 tablet (600 mg total) by mouth every 6 (six) hours as needed.     Elpidio Anis, PA-C 05/24/17 1610    Glynn Octave, MD 05/24/17 [phone]

## 2017-05-24 NOTE — ED Triage Notes (Signed)
Pt states recently discharged from hospital, pt with pain and swelling to L AC. Pt tender to touch. Pt denies any fevers or chills.

## 2017-05-26 ENCOUNTER — Encounter (HOSPITAL_COMMUNITY): Payer: Self-pay | Admitting: Emergency Medicine

## 2017-05-26 DIAGNOSIS — E876 Hypokalemia: Secondary | ICD-10-CM | POA: Diagnosis present

## 2017-05-26 DIAGNOSIS — W1830XA Fall on same level, unspecified, initial encounter: Secondary | ICD-10-CM | POA: Diagnosis present

## 2017-05-26 DIAGNOSIS — G4700 Insomnia, unspecified: Secondary | ICD-10-CM | POA: Diagnosis present

## 2017-05-26 DIAGNOSIS — L03114 Cellulitis of left upper limb: Secondary | ICD-10-CM | POA: Diagnosis present

## 2017-05-26 DIAGNOSIS — F419 Anxiety disorder, unspecified: Secondary | ICD-10-CM | POA: Diagnosis present

## 2017-05-26 DIAGNOSIS — A4101 Sepsis due to Methicillin susceptible Staphylococcus aureus: Principal | ICD-10-CM | POA: Diagnosis present

## 2017-05-26 DIAGNOSIS — S2242XA Multiple fractures of ribs, left side, initial encounter for closed fracture: Secondary | ICD-10-CM | POA: Diagnosis present

## 2017-05-26 DIAGNOSIS — F17210 Nicotine dependence, cigarettes, uncomplicated: Secondary | ICD-10-CM | POA: Diagnosis present

## 2017-05-26 DIAGNOSIS — I82612 Acute embolism and thrombosis of superficial veins of left upper extremity: Secondary | ICD-10-CM | POA: Diagnosis present

## 2017-05-26 NOTE — ED Triage Notes (Signed)
Pt recently discharged from hospital recently here for evaluation of redness and swelling to LAC, where his IV was placed. Area warm to touch, pt reports chills at home.

## 2017-05-27 ENCOUNTER — Inpatient Hospital Stay (HOSPITAL_COMMUNITY): Payer: Self-pay

## 2017-05-27 ENCOUNTER — Emergency Department (HOSPITAL_COMMUNITY): Payer: Self-pay

## 2017-05-27 ENCOUNTER — Inpatient Hospital Stay (HOSPITAL_COMMUNITY)
Admission: EM | Admit: 2017-05-27 | Discharge: 2017-06-02 | DRG: 872 | Disposition: A | Discharge status: Home / Self Care | Payer: Self-pay | Attending: Family Medicine | Admitting: Family Medicine

## 2017-05-27 ENCOUNTER — Encounter (HOSPITAL_COMMUNITY): Payer: Self-pay | Admitting: Physician Assistant

## 2017-05-27 DIAGNOSIS — E876 Hypokalemia: Secondary | ICD-10-CM | POA: Diagnosis not present

## 2017-05-27 DIAGNOSIS — I808 Phlebitis and thrombophlebitis of other sites: Secondary | ICD-10-CM | POA: Diagnosis present

## 2017-05-27 DIAGNOSIS — R7881 Bacteremia: Secondary | ICD-10-CM | POA: Diagnosis present

## 2017-05-27 DIAGNOSIS — I809 Phlebitis and thrombophlebitis of unspecified site: Secondary | ICD-10-CM

## 2017-05-27 DIAGNOSIS — L03114 Cellulitis of left upper limb: Secondary | ICD-10-CM | POA: Diagnosis present

## 2017-05-27 DIAGNOSIS — B9561 Methicillin susceptible Staphylococcus aureus infection as the cause of diseases classified elsewhere: Secondary | ICD-10-CM | POA: Diagnosis present

## 2017-05-27 DIAGNOSIS — M79609 Pain in unspecified limb: Secondary | ICD-10-CM

## 2017-05-27 DIAGNOSIS — M7989 Other specified soft tissue disorders: Secondary | ICD-10-CM

## 2017-05-27 DIAGNOSIS — L0390 Cellulitis, unspecified: Secondary | ICD-10-CM | POA: Diagnosis present

## 2017-05-27 DIAGNOSIS — W19XXXA Unspecified fall, initial encounter: Secondary | ICD-10-CM | POA: Diagnosis present

## 2017-05-27 DIAGNOSIS — A419 Sepsis, unspecified organism: Secondary | ICD-10-CM | POA: Diagnosis present

## 2017-05-27 DIAGNOSIS — S2242XA Multiple fractures of ribs, left side, initial encounter for closed fracture: Secondary | ICD-10-CM | POA: Diagnosis present

## 2017-05-27 HISTORY — DX: Cellulitis, unspecified: L03.90

## 2017-05-27 HISTORY — DX: Fracture of one rib, unspecified side, initial encounter for closed fracture: S22.39XA

## 2017-05-27 LAB — RAPID URINE DRUG SCREEN, HOSP PERFORMED
AMPHETAMINES: NOT DETECTED
BENZODIAZEPINES: NOT DETECTED
Barbiturates: NOT DETECTED
COCAINE: NOT DETECTED
OPIATES: NOT DETECTED
Tetrahydrocannabinol: NOT DETECTED

## 2017-05-27 LAB — BASIC METABOLIC PANEL
ANION GAP: 13 (ref 5–15)
BUN: 15 mg/dL (ref 6–20)
CHLORIDE: 98 mmol/L — AB (ref 101–111)
CO2: 21 mmol/L — AB (ref 22–32)
Calcium: 9.4 mg/dL (ref 8.9–10.3)
Creatinine, Ser: 1.02 mg/dL (ref 0.61–1.24)
GFR calc non Af Amer: >60 mL/min (ref >60)
Glucose, Bld: 128 mg/dL — ABNORMAL HIGH (ref 65–99)
POTASSIUM: 4.1 mmol/L (ref 3.5–5.1)
Sodium: 132 mmol/L — ABNORMAL LOW (ref 135–145)

## 2017-05-27 LAB — URINALYSIS, ROUTINE W REFLEX MICROSCOPIC
Bilirubin Urine: NEGATIVE
Glucose, UA: NEGATIVE mg/dL
HGB URINE DIPSTICK: NEGATIVE
Ketones, ur: 20 mg/dL — AB
LEUKOCYTES UA: NEGATIVE
Nitrite: NEGATIVE
PROTEIN: 30 mg/dL — AB
SQUAMOUS EPITHELIAL / LPF: NONE SEEN
Specific Gravity, Urine: 1.03 (ref 1.005–1.030)
pH: 5 (ref 5.0–8.0)

## 2017-05-27 LAB — CBC WITH DIFFERENTIAL/PLATELET
BASOS ABS: 0 10*3/uL (ref 0.0–0.1)
BASOS PCT: 0 %
Eosinophils Absolute: 0 10*3/uL (ref 0.0–0.7)
Eosinophils Relative: 0 %
HEMATOCRIT: 47.4 % (ref 39.0–52.0)
HEMOGLOBIN: 16.4 g/dL (ref 13.0–17.0)
LYMPHS PCT: 9 %
Lymphs Abs: 1.4 10*3/uL (ref 0.7–4.0)
MCH: 28.7 pg (ref 26.0–34.0)
MCHC: 34.6 g/dL (ref 30.0–36.0)
MCV: 83 fL (ref 78.0–100.0)
Monocytes Absolute: 1.9 10*3/uL — ABNORMAL HIGH (ref 0.1–1.0)
Monocytes Relative: 12 %
NEUTROS ABS: 12.5 10*3/uL — AB (ref 1.7–7.7)
NEUTROS PCT: 79 %
Platelets: 255 10*3/uL (ref 150–400)
RBC: 5.71 MIL/uL (ref 4.22–5.81)
RDW: 12.5 % (ref 11.5–15.5)
WBC: 15.8 10*3/uL — ABNORMAL HIGH (ref 4.0–10.5)

## 2017-05-27 LAB — LACTIC ACID, PLASMA
LACTIC ACID, VENOUS: 1.3 mmol/L (ref 0.5–1.9)
Lactic Acid, Venous: 1.2 mmol/L (ref 0.5–1.9)

## 2017-05-27 LAB — I-STAT CG4 LACTIC ACID, ED
LACTIC ACID, VENOUS: 0.91 mmol/L (ref 0.5–1.9)
Lactic Acid, Venous: 1.06 mmol/L (ref 0.5–1.9)

## 2017-05-27 MED ORDER — ONDANSETRON HCL 4 MG/2ML IJ SOLN: 4.0000 mg | Freq: Four times a day (QID) | INTRAMUSCULAR | Status: DC | PRN

## 2017-05-27 MED ORDER — OXYCODONE HCL ER 10 MG PO T12A
20.0000 mg | EXTENDED_RELEASE_TABLET | Freq: Two times a day (BID) | ORAL | Status: AC
  Administered 2017-05-27 – 2017-05-28 (×3): 20 mg via ORAL
  Filled 2017-05-27 (×3): qty 2

## 2017-05-27 MED ORDER — SENNOSIDES-DOCUSATE SODIUM 8.6-50 MG PO TABS: 1.0000 | ORAL_TABLET | Freq: Every evening | ORAL | Status: DC | PRN

## 2017-05-27 MED ORDER — BISACODYL 10 MG RE SUPP: 10.0000 mg | Freq: Every day | RECTAL | Status: DC | PRN

## 2017-05-27 MED ORDER — VANCOMYCIN HCL 10 G IV SOLR
1500.0000 mg | Freq: Two times a day (BID) | INTRAVENOUS | Status: DC
  Administered 2017-05-27 – 2017-05-28 (×2): 1500 mg via INTRAVENOUS
  Filled 2017-05-27 (×4): qty 1500

## 2017-05-27 MED ORDER — KETOROLAC TROMETHAMINE 30 MG/ML IJ SOLN: 30.0000 mg | Freq: Four times a day (QID) | INTRAMUSCULAR | Status: DC | PRN

## 2017-05-27 MED ORDER — SODIUM CHLORIDE 0.9 % IV SOLN
INTRAVENOUS | Status: AC
  Administered 2017-05-27 – 2017-05-29 (×5): via INTRAVENOUS

## 2017-05-27 MED ORDER — FENTANYL CITRATE (PF) 100 MCG/2ML IJ SOLN
50.0000 ug | Freq: Once | INTRAMUSCULAR | Status: AC
  Administered 2017-05-27: 50 ug via INTRAVENOUS
  Filled 2017-05-27: qty 2

## 2017-05-27 MED ORDER — LIDOCAINE 5 % EX PTCH
2.0000 | MEDICATED_PATCH | CUTANEOUS | Status: DC
  Administered 2017-05-27 – 2017-05-28 (×2): 2 via TRANSDERMAL
  Filled 2017-05-27 (×2): qty 2

## 2017-05-27 MED ORDER — IOPAMIDOL (ISOVUE-300) INJECTION 61%
INTRAVENOUS | Status: AC
  Administered 2017-05-27: 75 mL via INTRAVENOUS
  Filled 2017-05-27: qty 75

## 2017-05-27 MED ORDER — MORPHINE SULFATE (PF) 4 MG/ML IV SOLN
4.0000 mg | Freq: Once | INTRAVENOUS | Status: AC
  Administered 2017-05-27: 4 mg via INTRAVENOUS
  Filled 2017-05-27: qty 1

## 2017-05-27 MED ORDER — MORPHINE SULFATE (PF) 4 MG/ML IV SOLN
4.0000 mg | Freq: Four times a day (QID) | INTRAVENOUS | Status: DC | PRN
  Administered 2017-05-27 – 2017-05-28 (×4): 4 mg via INTRAVENOUS
  Filled 2017-05-27 (×4): qty 1

## 2017-05-27 MED ORDER — NICOTINE 14 MG/24HR TD PT24
14.0000 mg | MEDICATED_PATCH | Freq: Every day | TRANSDERMAL | Status: DC
  Administered 2017-05-27 – 2017-06-01 (×6): 14 mg via TRANSDERMAL
  Filled 2017-05-27 (×7): qty 1

## 2017-05-27 MED ORDER — ACETAMINOPHEN 650 MG RE SUPP: 650.0000 mg | Freq: Four times a day (QID) | RECTAL | Status: DC | PRN

## 2017-05-27 MED ORDER — PIPERACILLIN-TAZOBACTAM 3.375 G IVPB
3.3750 g | Freq: Three times a day (TID) | INTRAVENOUS | Status: DC
  Administered 2017-05-27 – 2017-05-28 (×3): 3.375 g via INTRAVENOUS
  Filled 2017-05-27 (×4): qty 50

## 2017-05-27 MED ORDER — VANCOMYCIN HCL IN DEXTROSE 1-5 GM/200ML-% IV SOLN
1000.0000 mg | Freq: Once | INTRAVENOUS | Status: AC
  Administered 2017-05-27: 1000 mg via INTRAVENOUS
  Filled 2017-05-27: qty 200

## 2017-05-27 MED ORDER — ONDANSETRON HCL 4 MG PO TABS: 4.0000 mg | ORAL_TABLET | Freq: Four times a day (QID) | ORAL | Status: DC | PRN

## 2017-05-27 MED ORDER — MORPHINE SULFATE (PF) 4 MG/ML IV SOLN
1.0000 mg | INTRAVENOUS | Status: DC | PRN
  Administered 2017-05-27 (×2): 1 mg via INTRAVENOUS
  Filled 2017-05-27 (×3): qty 1

## 2017-05-27 MED ORDER — ENOXAPARIN SODIUM 40 MG/0.4ML ~~LOC~~ SOLN
40.0000 mg | SUBCUTANEOUS | Status: DC
  Filled 2017-05-27 (×7): qty 0.4

## 2017-05-27 MED ORDER — PIPERACILLIN-TAZOBACTAM 3.375 G IVPB 30 MIN
3.3750 g | Freq: Once | INTRAVENOUS | Status: AC
  Administered 2017-05-27: 3.375 g via INTRAVENOUS
  Filled 2017-05-27: qty 50

## 2017-05-27 MED ORDER — ACETAMINOPHEN 325 MG PO TABS
650.0000 mg | ORAL_TABLET | Freq: Four times a day (QID) | ORAL | Status: DC | PRN
  Administered 2017-05-27 – 2017-05-29 (×6): 650 mg via ORAL
  Filled 2017-05-27 (×6): qty 2
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
## 2017-05-27 NOTE — ED Notes (Signed)
ED Provider at bedside. 

## 2017-05-27 NOTE — ED Notes (Signed)
Pt requesting pain medication. EDP aware 
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
## 2017-05-27 NOTE — ED Notes (Signed)
Patient in CT

## 2017-05-27 NOTE — ED Provider Notes (Signed)
[facility]-EMERGENCY DEPT Provider Note   CSN: [id number] Arrival date & time: 05/26/17  2206     History   Chief Complaint Chief Complaint  Patient presents with  . Arm Pain    HPI Douglas Daniels is a 36 y.o. male who presents to the ED with arm pain. Patient reports that 2 weeks ago he was hospitalized for rib fractures after a fall. He was d/c home and came back to the ED 3 days ago due to redness and pain to the left [facility] where his IV had been. He was instructed to take ibuprofen and apply warm compresses to the area. He has been doing that but the area has gotten larger, redness increased and there is swelling. He reports chills and ? Fever. He is still taking oxycodone and muscle relaxants for the rib fractures.   HPI  Past Medical History:  Diagnosis Date  . Fall 05/18/2017    Patient Active Problem List   Diagnosis Date Noted  . Fall 05/18/2017  . Multiple fractures of ribs, left side, initial encounter for closed fracture 05/15/2017    History reviewed. No pertinent surgical history.     Home Medications    Prior to Admission medications   Medication Sig Start Date End Date Taking? Authorizing Provider  ibuprofen (ADVIL,MOTRIN) 600 MG tablet Take 1 tablet (600 mg total) by mouth every 6 (six) hours as needed. 05/24/17   Elpidio Anis, PA-C  methocarbamol (ROBAXIN) 750 MG tablet Take 1 tablet (750 mg total) by mouth 3 (three) times daily. 05/18/17   Focht, Joyce Copa, PA  Oxycodone HCl 10 MG TABS Take 1 tablet (10 mg total) by mouth every 4 (four) hours as needed. 05/18/17   Jerre Simon, PA    Family History No family history on file.  Social History Social History  Substance Use Topics  . Smoking status: Current Every Day Smoker    Packs/day: 1.00    Types: Cigarettes  . Smokeless tobacco: Never Used  . Alcohol use Yes     Allergies   Patient has no known allergies.   Review of Systems Review of Systems  Constitutional: Positive for chills. Fever: ?    HENT: Negative.   Eyes: Negative for redness, itching and visual disturbance.  Respiratory: Negative for cough.   Cardiovascular: Positive for chest pain (due to multiple rib fractures).  Gastrointestinal: Negative for abdominal pain, diarrhea, nausea and vomiting.  Genitourinary: Negative for dysuria, frequency and urgency.  Musculoskeletal: Positive for arthralgias and back pain.  Skin: Positive for wound.  Neurological: Negative for dizziness and syncope.  Hematological: Negative for adenopathy.  Psychiatric/Behavioral: The patient is not nervous/anxious.      Physical Exam Updated Vital Signs BP 119/88 (BP Location: Right Arm)   Pulse 99   Temp 98.6 F (37 C) (Oral)   Resp 18   Ht 6' (1.829 m)   Wt 83.9 kg (185 lb)   SpO2 97%   BMI 25.09 kg/m   Physical Exam  Constitutional: He is oriented to person, place, and time. He appears well-developed and well-nourished. No distress.  HENT:  Head: Normocephalic.  Eyes: EOM are normal.  Neck: Neck supple.  Cardiovascular: Normal rate and regular rhythm.   Pulmonary/Chest: Effort normal and breath sounds normal.  Musculoskeletal:       Left elbow: He exhibits decreased range of motion and swelling. Tenderness found.       Arms: Radial pulses 2+, there is a puncture site at the left [facility] where  the patient had an IV during the time he was admitted to the hospital. There is a firm tender area surrounding the puncture site with increased warmth, swelling and there is erythema above and below the puncture site.   Neurological: He is alert and oriented to person, place, and time. No cranial nerve deficit.  Skin: Skin is warm and dry.  Psychiatric: He has a normal mood and affect.  Nursing note and vitals reviewed.    ED Treatments / Results  Labs (all labs ordered are listed, but only abnormal results are displayed) Labs Reviewed  CBC WITH DIFFERENTIAL/PLATELET - Abnormal; Notable for the following:       Result Value   WBC  15.8 (*)    Neutro Abs 12.5 (*)    Monocytes Absolute 1.9 (*)    All other components within normal limits  BASIC METABOLIC PANEL - Abnormal; Notable for the following:    Sodium 132 (*)    Chloride 98 (*)    CO2 21 (*)    Glucose, Bld 128 (*)    All other components within normal limits  RAPID URINE DRUG SCREEN, HOSP PERFORMED  URINALYSIS, ROUTINE W REFLEX MICROSCOPIC  I-STAT CG4 LACTIC ACID, ED    Radiology No results found.  Procedures Procedures (including critical care time)  Medications Ordered in ED Medications - No data to display   Initial Impression / Assessment and Plan / ED Course  I have reviewed the triage vital signs and the nursing notes.  Final Clinical Impressions(s) / ED Diagnoses  New Prescriptions New Prescriptions   No medications on file   Care turned over to [facility]audia Gibbons @ 2:15 am.   Janne Napoleoneese, Forest Redwine M, NP 05/27/17 0216    Ward, Layla MawKristen N, DO 05/27/17 [phone]

## 2017-05-27 NOTE — ED Notes (Signed)
Ordered Breakfast tray

## 2017-05-27 NOTE — ED Notes (Addendum)
Report attempted 

## 2017-05-27 NOTE — ED Notes (Signed)
Patient transported to X-ray 

## 2017-05-27 NOTE — ED Notes (Signed)
Patient transported to ultrasound.

## 2017-05-27 NOTE — ED Notes (Signed)
Patient returned from US.

## 2017-05-27 NOTE — ED Notes (Signed)
Provider notified of patients request for pain medication. No orders received

## 2017-05-27 NOTE — ED Notes (Addendum)
MD Melynda RippleHobbs made aware that pt was Positive for acute superficial vein thrombosis involving the cephalic vein

## 2017-05-27 NOTE — Progress Notes (Signed)
Preliminary results by tech - Left Upper Ext. Venous Duplex Completed. Negative for deep vein thrombosis. Positive for acute superficial vein thrombosis involving the cephalic vein at the left of the antecubital fossa extending into the mid forearm.  Marilynne Halstedita Jamar Casagrande, BS, RDMS, RVT

## 2017-05-27 NOTE — Progress Notes (Signed)
Pharmacy Antibiotic Note  Douglas Daniels is a 36 y.o. male admitted on 05/27/2017 with cellulitis.  Pharmacy has been consulted for Vancocin and Zosyn dosing.  Plan: Vancomycin 1000mg  given in ED then 1500mg  IV every 12 hours.  Goal trough 10-15 mcg/mL. Zosyn 3.375g IV q8h (4 hour infusion).  Height: 6' (182.9 cm) Weight: 185 lb (83.9 kg) IBW/kg (Calculated) : 77.6  Temp (24hrs), Avg:98.6 F (37 C), Min:98.6 F (37 C), Max:98.6 F (37 C)   Recent Labs Lab 05/27/17 0057 05/27/17 0107 05/27/17 0640  WBC 15.8*  --   --   CREATININE 1.02  --   --   LATICACIDVEN  --  1.06 0.91    Estimated Creatinine Clearance: 108.8 mL/min (by C-G formula based on SCr of 1.02 mg/dL).    No Known Allergies   Thank you for allowing pharmacy to be a part of this patient's care.  Douglas Daniels, PharmD, BCPS  05/27/2017 6:48 AM

## 2017-05-28 ENCOUNTER — Inpatient Hospital Stay (HOSPITAL_COMMUNITY): Payer: Self-pay

## 2017-05-28 ENCOUNTER — Encounter (HOSPITAL_COMMUNITY): Payer: Self-pay | Admitting: General Practice

## 2017-05-28 DIAGNOSIS — A4101 Sepsis due to Methicillin susceptible Staphylococcus aureus: Principal | ICD-10-CM

## 2017-05-28 DIAGNOSIS — F17210 Nicotine dependence, cigarettes, uncomplicated: Secondary | ICD-10-CM

## 2017-05-28 DIAGNOSIS — B9561 Methicillin susceptible Staphylococcus aureus infection as the cause of diseases classified elsewhere: Secondary | ICD-10-CM

## 2017-05-28 DIAGNOSIS — Z79899 Other long term (current) drug therapy: Secondary | ICD-10-CM

## 2017-05-28 LAB — CBC
HEMATOCRIT: 37.8 % — AB (ref 39.0–52.0)
HEMOGLOBIN: 12.8 g/dL — AB (ref 13.0–17.0)
MCH: 27.8 pg (ref 26.0–34.0)
MCHC: 33.9 g/dL (ref 30.0–36.0)
MCV: 82.2 fL (ref 78.0–100.0)
Platelets: 232 10*3/uL (ref 150–400)
RBC: 4.6 MIL/uL (ref 4.22–5.81)
RDW: 12.5 % (ref 11.5–15.5)
WBC: 9.4 10*3/uL (ref 4.0–10.5)

## 2017-05-28 LAB — COMPREHENSIVE METABOLIC PANEL
ALBUMIN: 2.9 g/dL — AB (ref 3.5–5.0)
ALT: 31 U/L (ref 17–63)
ANION GAP: 9 (ref 5–15)
AST: 36 U/L (ref 15–41)
Alkaline Phosphatase: 66 U/L (ref 38–126)
BUN: 6 mg/dL (ref 6–20)
CHLORIDE: 100 mmol/L — AB (ref 101–111)
CO2: 23 mmol/L (ref 22–32)
Calcium: 8.3 mg/dL — ABNORMAL LOW (ref 8.9–10.3)
Creatinine, Ser: 0.94 mg/dL (ref 0.61–1.24)
GFR calc Af Amer: >60 mL/min (ref >60)
Glucose, Bld: 105 mg/dL — ABNORMAL HIGH (ref 65–99)
POTASSIUM: 3.4 mmol/L — AB (ref 3.5–5.1)
Sodium: 132 mmol/L — ABNORMAL LOW (ref 135–145)
TOTAL PROTEIN: 6.1 g/dL — AB (ref 6.5–8.1)
Total Bilirubin: 1.1 mg/dL (ref 0.3–1.2)

## 2017-05-28 LAB — BLOOD CULTURE ID PANEL (REFLEXED)
ACINETOBACTER BAUMANNII: NOT DETECTED
CANDIDA ALBICANS: NOT DETECTED
CANDIDA KRUSEI: NOT DETECTED
Candida glabrata: NOT DETECTED
Candida parapsilosis: NOT DETECTED
Candida tropicalis: NOT DETECTED
ENTEROBACTERIACEAE SPECIES: NOT DETECTED
ENTEROCOCCUS SPECIES: NOT DETECTED
ESCHERICHIA COLI: NOT DETECTED
Enterobacter cloacae complex: NOT DETECTED
Haemophilus influenzae: NOT DETECTED
Klebsiella oxytoca: NOT DETECTED
Klebsiella pneumoniae: NOT DETECTED
Listeria monocytogenes: NOT DETECTED
Methicillin resistance: NOT DETECTED
NEISSERIA MENINGITIDIS: NOT DETECTED
PROTEUS SPECIES: NOT DETECTED
PSEUDOMONAS AERUGINOSA: NOT DETECTED
STAPHYLOCOCCUS SPECIES: DETECTED — AB
STREPTOCOCCUS PNEUMONIAE: NOT DETECTED
STREPTOCOCCUS PYOGENES: NOT DETECTED
STREPTOCOCCUS SPECIES: NOT DETECTED
Serratia marcescens: NOT DETECTED
Staphylococcus aureus (BCID): DETECTED — AB
Streptococcus agalactiae: NOT DETECTED

## 2017-05-28 LAB — PROTIME-INR
INR: 1.15
Prothrombin Time: 14.7 seconds (ref 11.4–15.2)

## 2017-05-28 MED ORDER — MORPHINE SULFATE (PF) 4 MG/ML IV SOLN
2.0000 mg | INTRAVENOUS | Status: DC | PRN
  Administered 2017-05-28 – 2017-05-30 (×8): 2 mg via INTRAVENOUS
  Filled 2017-05-28 (×9): qty 1

## 2017-05-28 MED ORDER — KETOROLAC TROMETHAMINE 30 MG/ML IJ SOLN
30.0000 mg | Freq: Four times a day (QID) | INTRAMUSCULAR | Status: AC
  Administered 2017-05-28 – 2017-05-29 (×5): 30 mg via INTRAVENOUS
  Filled 2017-05-28 (×5): qty 1

## 2017-05-28 MED ORDER — METHOCARBAMOL 750 MG PO TABS
750.0000 mg | ORAL_TABLET | Freq: Three times a day (TID) | ORAL | Status: DC
  Administered 2017-05-28 – 2017-06-02 (×16): 750 mg via ORAL
  Filled 2017-05-28 (×16): qty 1

## 2017-05-28 MED ORDER — OXYCODONE HCL 5 MG PO TABS
10.0000 mg | ORAL_TABLET | ORAL | Status: DC | PRN
  Administered 2017-05-28 – 2017-06-02 (×22): 10 mg via ORAL
  Filled 2017-05-28 (×22): qty 2

## 2017-05-28 MED ORDER — IOPAMIDOL (ISOVUE-300) INJECTION 61%
75.0000 mL | Freq: Once | INTRAVENOUS | Status: AC | PRN
  Administered 2017-05-28: 75 mL via INTRAVENOUS

## 2017-05-28 MED ORDER — IOPAMIDOL (ISOVUE-300) INJECTION 61%
INTRAVENOUS | Status: AC
  Filled 2017-05-28: qty 75

## 2017-05-28 MED ORDER — SENNOSIDES-DOCUSATE SODIUM 8.6-50 MG PO TABS
2.0000 | ORAL_TABLET | Freq: Two times a day (BID) | ORAL | Status: DC
  Administered 2017-05-28 – 2017-06-02 (×10): 2 via ORAL
  Filled 2017-05-28 (×10): qty 2

## 2017-05-28 MED ORDER — CEFAZOLIN SODIUM-DEXTROSE 2-4 GM/100ML-% IV SOLN
2.0000 g | Freq: Three times a day (TID) | INTRAVENOUS | Status: DC
  Administered 2017-05-28 – 2017-06-02 (×14): 2 g via INTRAVENOUS
  Filled 2017-05-28 (×18): qty 100

## 2017-05-28 NOTE — Progress Notes (Signed)
PT Cancellation Note  Patient Details Name: Douglas BaumannDavid Bowring MRN: [id number] DOB: 1981-03-07   Cancelled Treatment:    Reason Eval/Treat Not Completed: Other (comment). Pt reports being up and walking this AM. Explained role of P.T to pt who stated he did not want to get up at this time and despite having been able to mobilize this morning wanted to defer eval until next date. Will attempt 6/23    Wenz[place] B Anushri Casalino 05/28/2017, 12:14 PM  Delaney MeigsMaija Tabor Antoinette Borgwardt, PT [phone]
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
## 2017-05-28 NOTE — Progress Notes (Signed)
PHARMACY - PHYSICIAN COMMUNICATION CRITICAL VALUE ALERT - BLOOD CULTURE IDENTIFICATION (BCID)  No results found for this or any previous visit.  Name of physician (or Provider) Contacted:  N/A  Changes to prescribed antibiotics required:   Gram stain called and reported as Gram positive cocci in clusters in 1/2 blood cultures.   Patient currently receiving Vancomycin and Zosyn for cellulitis.  No changes, will follow up final results.    Douglas Daniels, Douglas Daniels 05/28/2017  4:16 AM
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
## 2017-05-28 NOTE — Progress Notes (Signed)
PHARMACY - PHYSICIAN COMMUNICATION CRITICAL VALUE ALERT - BLOOD CULTURE IDENTIFICATION (BCID)  Results for orders placed or performed during the hospital encounter of 05/27/17  Blood Culture ID Panel (Reflexed) (Collected: 05/27/2017  6:26 AM)  Result Value Ref Range   Enterococcus species NOT DETECTED NOT DETECTED   Listeria monocytogenes NOT DETECTED NOT DETECTED   Staphylococcus species DETECTED (A) NOT DETECTED   Staphylococcus aureus DETECTED (A) NOT DETECTED   Methicillin resistance NOT DETECTED NOT DETECTED   Streptococcus species NOT DETECTED NOT DETECTED   Streptococcus agalactiae NOT DETECTED NOT DETECTED   Streptococcus pneumoniae NOT DETECTED NOT DETECTED   Streptococcus pyogenes NOT DETECTED NOT DETECTED   Acinetobacter baumannii NOT DETECTED NOT DETECTED   Enterobacteriaceae species NOT DETECTED NOT DETECTED   Enterobacter cloacae complex NOT DETECTED NOT DETECTED   Escherichia coli NOT DETECTED NOT DETECTED   Klebsiella oxytoca NOT DETECTED NOT DETECTED   Klebsiella pneumoniae NOT DETECTED NOT DETECTED   Proteus species NOT DETECTED NOT DETECTED   Serratia marcescens NOT DETECTED NOT DETECTED   Haemophilus influenzae NOT DETECTED NOT DETECTED   Neisseria meningitidis NOT DETECTED NOT DETECTED   Pseudomonas aeruginosa NOT DETECTED NOT DETECTED   Candida albicans NOT DETECTED NOT DETECTED   Candida glabrata NOT DETECTED NOT DETECTED   Candida krusei NOT DETECTED NOT DETECTED   Candida parapsilosis NOT DETECTED NOT DETECTED   Candida tropicalis NOT DETECTED NOT DETECTED    Name of physician (or Provider) Contacted: Dr. Laural BenesJohnson  Changes to prescribed antibiotics required: Patient currently on vancomycin and zosyn. BCID was repeated this morning and is showing MSSA, recommend discontinuing vanc/zosyn and narrowing antibiotics to cefazolin 2g IV q8h.  Casilda Carlsaylor Aleisa Howk, PharmD, BCPS PGY-2 Infectious Diseases Pharmacy Resident Pager: [phone] 05/28/2017  8:25 AM

## 2017-05-29 DIAGNOSIS — E876 Hypokalemia: Secondary | ICD-10-CM | POA: Diagnosis not present

## 2017-05-29 DIAGNOSIS — R7881 Bacteremia: Secondary | ICD-10-CM

## 2017-05-29 DIAGNOSIS — I808 Phlebitis and thrombophlebitis of other sites: Secondary | ICD-10-CM | POA: Diagnosis present

## 2017-05-29 LAB — COMPREHENSIVE METABOLIC PANEL
ALBUMIN: 2.8 g/dL — AB (ref 3.5–5.0)
ALK PHOS: 72 U/L (ref 38–126)
ALT: 32 U/L (ref 17–63)
AST: 30 U/L (ref 15–41)
Anion gap: 9 (ref 5–15)
BILIRUBIN TOTAL: 0.4 mg/dL (ref 0.3–1.2)
BUN: 7 mg/dL (ref 6–20)
CALCIUM: 8.3 mg/dL — AB (ref 8.9–10.3)
CO2: 25 mmol/L (ref 22–32)
CREATININE: 0.88 mg/dL (ref 0.61–1.24)
Chloride: 105 mmol/L (ref 101–111)
GFR calc Af Amer: >60 mL/min (ref >60)
GFR calc non Af Amer: >60 mL/min (ref >60)
GLUCOSE: 105 mg/dL — AB (ref 65–99)
Potassium: 3.2 mmol/L — ABNORMAL LOW (ref 3.5–5.1)
SODIUM: 139 mmol/L (ref 135–145)
Total Protein: 5.7 g/dL — ABNORMAL LOW (ref 6.5–8.1)

## 2017-05-29 LAB — CBC WITH DIFFERENTIAL/PLATELET
BASOS PCT: 0 %
Basophils Absolute: 0 10*3/uL (ref 0.0–0.1)
EOS ABS: 0.3 10*3/uL (ref 0.0–0.7)
Eosinophils Relative: 4 %
HEMATOCRIT: 36.3 % — AB (ref 39.0–52.0)
HEMOGLOBIN: 12.2 g/dL — AB (ref 13.0–17.0)
LYMPHS ABS: 1.6 10*3/uL (ref 0.7–4.0)
Lymphocytes Relative: 26 %
MCH: 27.8 pg (ref 26.0–34.0)
MCHC: 33.6 g/dL (ref 30.0–36.0)
MCV: 82.7 fL (ref 78.0–100.0)
Monocytes Absolute: 1 10*3/uL (ref 0.1–1.0)
Monocytes Relative: 17 %
NEUTROS ABS: 3.2 10*3/uL (ref 1.7–7.7)
NEUTROS PCT: 53 %
Platelets: 229 10*3/uL (ref 150–400)
RBC: 4.39 MIL/uL (ref 4.22–5.81)
RDW: 12.6 % (ref 11.5–15.5)
WBC: 6.1 10*3/uL (ref 4.0–10.5)

## 2017-05-29 LAB — MAGNESIUM: Magnesium: 1.8 mg/dL (ref 1.7–2.4)

## 2017-05-29 MED ORDER — POTASSIUM CHLORIDE CRYS ER 20 MEQ PO TBCR
60.0000 meq | EXTENDED_RELEASE_TABLET | Freq: Once | ORAL | Status: AC
  Administered 2017-05-29: 60 meq via ORAL
  Filled 2017-05-29: qty 3

## 2017-05-29 MED ORDER — TRAZODONE HCL 50 MG PO TABS
50.0000 mg | ORAL_TABLET | Freq: Every evening | ORAL | Status: DC | PRN
  Filled 2017-05-29: qty 1

## 2017-05-29 NOTE — Progress Notes (Signed)
  PT Cancellation Note  Patient Details Name: Andris BaumannDavid Teng MRN: [id number] DOB: 1981/10/14   Cancelled Treatment:    Reason Eval/Treat Not Completed: PT screened, no needs identified, will sign off. Pt refused PT yesterday but was up independently. Refused as well today and reported that he felt that he had an understanding of pain control with rib fractures and mobility and was committed to ambulating 3-5x/ day. PT signing off.    Bryne Lindon L Aleaya Latona 05/29/2017, 10:55 AM

## 2017-05-29 NOTE — Progress Notes (Signed)
[facility]  Date of Admission:  05/27/2017   Total days of antibiotics 3        Day 2 cefazolin         ASSESSMENT: He has septic thrombophlebitis cellulitis and transient MSSA bacteremia related to a previous left arm IV. He is improving on antibiotic therapy. The CT scan did not show any abscess. Repeat blood cultures are negative to date and transthoracic echocardiogram has been ordered. I would simply continue IV cefazolin for now.  PLAN: 1. Continue cefazolin 2. Await results of repeat blood cultures and transthoracic echocardiogram 3. I will have Dr. Drue SecondSnider follow-up on Monday, 05/31/2017  Principal Problem:   MSSA bacteremia Active Problems:   Cellulitis of left arm   Septic thrombophlebitis   Multiple fractures of ribs, left side, initial encounter for closed fracture   Fall   Sepsis (HCC)   Hypokalemia   . enoxaparin (LOVENOX) injection  40 mg Subcutaneous Q24H  . ketorolac  30 mg Intravenous Q6H  . lidocaine  2 patch Transdermal Q24H  . methocarbamol  750 mg Oral TID  . nicotine  14 mg Transdermal Daily  . senna-docusate  2 tablet Oral BID    SUBJECTIVE: His left arm is beginning to feel a little less swollen and painful. He is still having some fever but no longer having chills and sweats like he was before admission. His previous left arm IV site had been draining bloody, purulent fluid up until last night when it stopped. He has difficulty keeping his left arm elevated because of his recent left rib fractures.  Review of Systems: Review of Systems  Constitutional: Positive for fever. Negative for chills and diaphoresis.  Respiratory: Negative for cough.   Cardiovascular: Positive for chest pain.  Gastrointestinal: Negative for abdominal pain, diarrhea, nausea and vomiting.  Musculoskeletal:       As noted in history of present illness.    No Known Allergies  OBJECTIVE: Vitals:   05/28/17 0549 05/28/17 1437 05/28/17  2044 05/29/17 0648  BP: 113/68 101/64 116/78 121/80  Pulse: 95 82 88 77  Resp: 20 18 19 19   Temp: (!) 102.1 F (38.9 C) 98.7 F (37.1 C) 98.7 F (37.1 C) 98.1 F (36.7 C)  TempSrc: Oral Oral Oral Oral  SpO2: 94% 96% 96% 97%  Weight:      Height:       Body mass index is 25.09 kg/m.  Physical Exam  Constitutional: He is oriented to person, place, and time.  I found him walking laps in the hallway and talking with his nurse.  Cardiovascular: Normal rate and regular rhythm.   No murmur heard. Pulmonary/Chest: Effort normal and breath sounds normal. He has no wheezes. He has no rales.  Musculoskeletal:  He has diffuse swelling of his left forearm. There is a small area of erythema and scab at his previous IV site in the left antecubital region.  Neurological: He is alert and oriented to person, place, and time.  Skin: No rash noted.  Psychiatric: Mood and affect normal.    Lab Results Lab Results  Component Value Date   WBC 6.1 05/29/2017   HGB 12.2 (L) 05/29/2017   HCT 36.3 (L) 05/29/2017   MCV 82.7 05/29/2017   PLT 229 05/29/2017    Lab Results  Component Value Date   CREATININE 0.88 05/29/2017   BUN 7 05/29/2017   NA 139 05/29/2017   K 3.2 (L) 05/29/2017  CL 105 05/29/2017   CO2 25 05/29/2017    Lab Results  Component Value Date   ALT 32 05/29/2017   AST 30 05/29/2017   ALKPHOS 72 05/29/2017   BILITOT 0.4 05/29/2017     Microbiology: Recent Results (from the past 240 hour(s))  Blood Culture (routine x 2)     Status: None (Preliminary result)   Collection Time: 05/27/17  6:21 AM  Result Value Ref Range Status   Specimen Description BLOOD RIGHT ANTECUBITAL  Final   Special Requests   Final    BOTTLES DRAWN AEROBIC AND ANAEROBIC Blood Culture adequate volume   Culture NO GROWTH 2 DAYS  Final   Report Status PENDING  Incomplete  Blood Culture (routine x 2)     Status: Abnormal (Preliminary result)   Collection Time: 05/27/17  6:26 AM  Result Value Ref  Range Status   Specimen Description BLOOD RIGHT HAND  Final   Special Requests IN PEDIATRIC BOTTLE Blood Culture adequate volume  Final   Culture  Setup Time   Final    IN PEDIATRIC BOTTLE GRAM POSITIVE COCCI IN CLUSTERS CRITICAL RESULT CALLED TO, READ BACK BY AND VERIFIED WITH: G.ABBOTT,PARMD 0400 05/28/17 M.Mirage Pfefferkorn CRITICAL RESULT CALLED TO, READ BACK BY AND VERIFIED WITH: PHARMD T STONE [id number] MLM    Culture (A)  Final    STAPHYLOCOCCUS AUREUS SUSCEPTIBILITIES TO FOLLOW    Report Status PENDING  Incomplete  Blood Culture ID Panel (Reflexed)     Status: Abnormal   Collection Time: 05/27/17  6:26 AM  Result Value Ref Range Status   Enterococcus species NOT DETECTED NOT DETECTED Final   Listeria monocytogenes NOT DETECTED NOT DETECTED Final   Staphylococcus species DETECTED (A) NOT DETECTED Final    Comment: CRITICAL RESULT CALLED TO, READ BACK BY AND VERIFIED WITH: PHARMD T STONE [id number] MLM    Staphylococcus aureus DETECTED (A) NOT DETECTED Final    Comment: Methicillin (oxacillin) susceptible Staphylococcus aureus (MSSA). Preferred therapy is anti staphylococcal beta lactam antibiotic (Cefazolin or Nafcillin), unless clinically contraindicated. CRITICAL RESULT CALLED TO, READ BACK BY AND VERIFIED WITH: PHARMD T STONE [id number] MLM    Methicillin resistance NOT DETECTED NOT DETECTED Final   Streptococcus species NOT DETECTED NOT DETECTED Final   Streptococcus agalactiae NOT DETECTED NOT DETECTED Final   Streptococcus pneumoniae NOT DETECTED NOT DETECTED Final   Streptococcus pyogenes NOT DETECTED NOT DETECTED Final   Acinetobacter baumannii NOT DETECTED NOT DETECTED Final   Enterobacteriaceae species NOT DETECTED NOT DETECTED Final   Enterobacter cloacae complex NOT DETECTED NOT DETECTED Final   Escherichia coli NOT DETECTED NOT DETECTED Final   Klebsiella oxytoca NOT DETECTED NOT DETECTED Final   Klebsiella pneumoniae NOT DETECTED NOT DETECTED Final   Proteus  species NOT DETECTED NOT DETECTED Final   Serratia marcescens NOT DETECTED NOT DETECTED Final   Haemophilus influenzae NOT DETECTED NOT DETECTED Final   Neisseria meningitidis NOT DETECTED NOT DETECTED Final   Pseudomonas aeruginosa NOT DETECTED NOT DETECTED Final   Candida albicans NOT DETECTED NOT DETECTED Final   Candida glabrata NOT DETECTED NOT DETECTED Final   Candida krusei NOT DETECTED NOT DETECTED Final   Candida parapsilosis NOT DETECTED NOT DETECTED Final   Candida tropicalis NOT DETECTED NOT DETECTED Final    Cliffton Asters, MD [facility] [phone] pager   [phone] cell 05/29/2017, 3:19 PMPatient ID: Douglas Daniels, male   DOB: 1981-06-23, 36 y.o.   MRN: [id number]

## 2017-05-29 NOTE — Progress Notes (Addendum)
PROGRESS NOTE    Douglas Daniels  ZOX:[id number]  DOB: January 12, 1981  DOA: 05/27/2017 PCP: Patient, No Pcp Per   Brief Admission Hx: 36 y.o. male with recent discharge from the hospital after suffering multiple read fractures after alcohol intoxication with fall, now presenting to the emergency department with redness, swelling on the left antecubital area.  MDM/Assessment & Plan:   MSSA Bacteremia - Source is likely septic thrombophlebitis of left cephalic vein - continue IV cefazolin 2gm every 6h, repeat BC done 6/23, Check Echocardiogram, Will eventually need PICC line.  Pt reports he has been having rigors at home for past week.  Pt already reports significant improvement with overnight antibiotic treatment.  CT left upper extremity was done to rule out abscess.  Appreciate assistance from ID specialists.   Recent fall with multiple rib fractures - CT Head, chest, abdomen and pelvis neg for bleeding issues  Chest x-ray negative for pneumothorax, stable rib fractures.  Pain control.  Continue to monitor.    Tobacco Abuse - counseling on cessation and nicotine patch.    Insomnia - trial of trazodone 50 QHS prn  Hypokalemia - oral replacement ordered, check magnesium.   DVT prophylaxis: enoxaparin Code Status: Full  Family Communication: none present Disposition Plan: TBD  Consultants:  Infectious disease   Subjective: Pt says he already is feeling better since admission.    Objective: Vitals:   05/28/17 0549 05/28/17 1437 05/28/17 2044 05/29/17 0648  BP: 113/68 101/64 116/78 121/80  Pulse: 95 82 88 77  Resp: 20 18 19 19   Temp: (!) 102.1 F (38.9 C) 98.7 F (37.1 C) 98.7 F (37.1 C) 98.1 F (36.7 C)  TempSrc: Oral Oral Oral Oral  SpO2: 94% 96% 96% 97%  Weight:      Height:        Intake/Output Summary (Last 24 hours) at 05/29/17 1108 Last data filed at 05/29/17 0943  Gross per 24 hour  Intake          3781.66 ml  Output             1650 ml  Net          2131.66  ml   Filed Weights   05/26/17 2214 05/27/17 1453  Weight: 83.9 kg (185 lb) 83.9 kg (185 lb)     REVIEW OF SYSTEMS  As per history otherwise all reviewed and reported negative  Exam:  General exam: awake, alert, NAD.   Respiratory system: Clear. No increased work of breathing. Cardiovascular system: S1 & S2 heard, RRR. No JVD, murmurs, gallops, clicks or pedal edema. Gastrointestinal system: Abdomen is nondistended, soft and nontender. Normal bowel sounds heard. Central nervous system: Alert and oriented. No focal neurological deficits. Extremities: swollen painful, red left antecubital fossa with surrounding erythema, edema, limited ROM (due to pain).   Data Reviewed: Basic Metabolic Panel:  Recent Labs Lab 05/27/17 0057 05/28/17 0640 05/29/17 0538  NA 132* 132* 139  K 4.1 3.4* 3.2*  CL 98* 100* 105  CO2 21* 23 25  GLUCOSE 128* 105* 105*  BUN 15 6 7   CREATININE 1.02 0.94 0.88  CALCIUM 9.4 8.3* 8.3*   Liver Function Tests:  Recent Labs Lab 05/28/17 0640 05/29/17 0538  AST 36 30  ALT 31 32  ALKPHOS 66 72  BILITOT 1.1 0.4  PROT 6.1* 5.7*  ALBUMIN 2.9* 2.8*   No results for input(s): LIPASE, AMYLASE in the last 168 hours. No results for input(s): AMMONIA in the last 168 hours. CBC:  Recent Labs Lab 05/27/17 0057 05/28/17 0640 05/29/17 0538  WBC 15.8* 9.4 6.1  NEUTROABS 12.5*  --  3.2  HGB 16.4 12.8* 12.2*  HCT 47.4 37.8* 36.3*  MCV 83.0 82.2 82.7  PLT 255 232 229   Cardiac Enzymes: No results for input(s): CKTOTAL, CKMB, CKMBINDEX, TROPONINI in the last 168 hours. CBG (last 3)  No results for input(s): GLUCAP in the last 72 hours. Recent Results (from the past 240 hour(s))  Blood Culture (routine x 2)     Status: None (Preliminary result)   Collection Time: 05/27/17  6:21 AM  Result Value Ref Range Status   Specimen Description BLOOD RIGHT ANTECUBITAL  Final   Special Requests   Final    BOTTLES DRAWN AEROBIC AND ANAEROBIC Blood Culture  adequate volume   Culture NO GROWTH 2 DAYS  Final   Report Status PENDING  Incomplete  Blood Culture (routine x 2)     Status: Abnormal (Preliminary result)   Collection Time: 05/27/17  6:26 AM  Result Value Ref Range Status   Specimen Description BLOOD RIGHT HAND  Final   Special Requests IN PEDIATRIC BOTTLE Blood Culture adequate volume  Final   Culture  Setup Time   Final    IN PEDIATRIC BOTTLE GRAM POSITIVE COCCI IN CLUSTERS CRITICAL RESULT CALLED TO, READ BACK BY AND VERIFIED WITH: G.ABBOTT,PARMD 0400 05/28/17 M.CAMPBELL CRITICAL RESULT CALLED TO, READ BACK BY AND VERIFIED WITH: PHARMD T STONE [id number] MLM    Culture (A)  Final    STAPHYLOCOCCUS AUREUS SUSCEPTIBILITIES TO FOLLOW    Report Status PENDING  Incomplete  Blood Culture ID Panel (Reflexed)     Status: Abnormal   Collection Time: 05/27/17  6:26 AM  Result Value Ref Range Status   Enterococcus species NOT DETECTED NOT DETECTED Final   Listeria monocytogenes NOT DETECTED NOT DETECTED Final   Staphylococcus species DETECTED (A) NOT DETECTED Final    Comment: CRITICAL RESULT CALLED TO, READ BACK BY AND VERIFIED WITH: PHARMD T STONE [id number] [phone] MLM    Staphylococcus aureus DETECTED (A) NOT DETECTED Final    Comment: Methicillin (oxacillin) susceptible Staphylococcus aureus (MSSA). Preferred therapy is anti staphylococcal beta lactam antibiotic (Cefazolin or Nafcillin), unless clinically contraindicated. CRITICAL RESULT CALLED TO, READ BACK BY AND VERIFIED WITH: PHARMD T STONE [phone] MLM    Methicillin resistance NOT DETECTED NOT DETECTED Final   Streptococcus species NOT DETECTED NOT DETECTED Final   Streptococcus agalactiae NOT DETECTED NOT DETECTED Final   Streptococcus pneumoniae NOT DETECTED NOT DETECTED Final   Streptococcus pyogenes NOT DETECTED NOT DETECTED Final   Acinetobacter baumannii NOT DETECTED NOT DETECTED Final   Enterobacteriaceae species NOT DETECTED NOT DETECTED Final   Enterobacter cloacae  complex NOT DETECTED NOT DETECTED Final   Escherichia coli NOT DETECTED NOT DETECTED Final   Klebsiella oxytoca NOT DETECTED NOT DETECTED Final   Klebsiella pneumoniae NOT DETECTED NOT DETECTED Final   Proteus species NOT DETECTED NOT DETECTED Final   Serratia marcescens NOT DETECTED NOT DETECTED Final   Haemophilus influenzae NOT DETECTED NOT DETECTED Final   Neisseria meningitidis NOT DETECTED NOT DETECTED Final   Pseudomonas aeruginosa NOT DETECTED NOT DETECTED Final   Candida albicans NOT DETECTED NOT DETECTED Final   Candida glabrata NOT DETECTED NOT DETECTED Final   Candida krusei NOT DETECTED NOT DETECTED Final   Candida parapsilosis NOT DETECTED NOT DETECTED Final   Candida tropicalis NOT DETECTED NOT DETECTED Final     Studies: Ct Extrem Up Entire Arm  L W/cm  Result Date: 05/29/2017 CLINICAL DATA:  Cellulitis.  Septic thrombophlebitis. EXAM: CT OF THE UPPER LEFT EXTREMITY WITH CONTRAST TECHNIQUE: Multidetector CT imaging of the upper left extremity was performed according to the standard protocol following intravenous contrast administration. COMPARISON:  Radiographs dated 05/27/2017 CONTRAST:  75mL ISOVUE-300 IOPAMIDOL (ISOVUE-300) INJECTION 61% FINDINGS: Bones/Joint/Cartilage The visualized bones of the left arm are normal. Multiple left rib fractures. Muscles and Tendons Negative. Soft tissues Edema in the the subcutaneous fat of the proximal forearm and around the antecubital fossa. There is no definable abscess. IMPRESSION: Findings consistent with cellulitis of left arm as described. No definable abscess, myositis, or osteomyelitis. The study is technically limited due to necessary patient position. Electronically Signed   By: Francene Boyers M.D.   On: 05/29/2017 08:19   Scheduled Meds: . enoxaparin (LOVENOX) injection  40 mg Subcutaneous Q24H  . ketorolac  30 mg Intravenous Q6H  . lidocaine  2 patch Transdermal Q24H  . methocarbamol  750 mg Oral TID  . nicotine  14 mg  Transdermal Daily  . potassium chloride  60 mEq Oral Once  . senna-docusate  2 tablet Oral BID   Continuous Infusions: . sodium chloride 100 mL/hr at 05/29/17 0500  .  ceFAZolin (ANCEF) IV Stopped (05/29/17 0530)    Principal Problem:   Sepsis (HCC) Active Problems:   Multiple fractures of ribs, left side, initial encounter for closed fracture   Fall   Cellulitis of left arm   Cellulitis  Time spent:   Standley Dakins, MD, FAAFP [facility] Pager [phone]  If 7PM-7AM, please contact night-coverage [email or web address] Password [email or web address] 05/29/2017, 11:08 AM    LOS: 2 days

## 2017-05-30 ENCOUNTER — Inpatient Hospital Stay (HOSPITAL_COMMUNITY): Payer: Self-pay

## 2017-05-30 DIAGNOSIS — R7881 Bacteremia: Secondary | ICD-10-CM

## 2017-05-30 DIAGNOSIS — E876 Hypokalemia: Secondary | ICD-10-CM

## 2017-05-30 LAB — ECHOCARDIOGRAM COMPLETE
AVLVOTPG: 3 mmHg
E decel time: 280 msec
EERAT: 4.99
FS: 15 % — AB (ref 28–44)
Height: 72 in
IVS/LV PW RATIO, ED: 0.89
LA ID, A-P, ES: 38 mm
LA diam end sys: 38 mm
LA diam index: 1.84 cm/m2
LA vol A4C: 44.5 ml
LA vol index: 22.5 mL/m2
LA vol: 46.3 mL
LDCA: 4.52 cm2
LV TDI E'LATERAL: 13.3
LVEEAVG: 4.99
LVEEMED: 4.99
LVELAT: 13.3 cm/s
LVOT VTI: 20.8 cm
LVOT diameter: 24 mm
LVOTPV: 84.9 cm/s
LVOTSV: 94 mL
MV Dec: 280
MV pk A vel: 52.3 m/s
MVPKEVEL: 66.4 m/s
PW: 10.9 mm — AB (ref 0.6–1.1)
RV LATERAL S' VELOCITY: 14.4 cm/s
TAPSE: 23.6 mm
TDI e' medial: 12
Weight: 2960 oz

## 2017-05-30 LAB — BASIC METABOLIC PANEL
ANION GAP: 8 (ref 5–15)
BUN: 7 mg/dL (ref 6–20)
CALCIUM: 8.4 mg/dL — AB (ref 8.9–10.3)
CHLORIDE: 107 mmol/L (ref 101–111)
CO2: 24 mmol/L (ref 22–32)
CREATININE: 0.81 mg/dL (ref 0.61–1.24)
GFR calc Af Amer: >60 mL/min (ref >60)
GFR calc non Af Amer: >60 mL/min (ref >60)
GLUCOSE: 83 mg/dL (ref 65–99)
Potassium: 4 mmol/L (ref 3.5–5.1)
Sodium: 139 mmol/L (ref 135–145)

## 2017-05-30 LAB — MAGNESIUM: Magnesium: 1.9 mg/dL (ref 1.7–2.4)

## 2017-05-30 MED ORDER — ACETAMINOPHEN 325 MG PO TABS
650.0000 mg | ORAL_TABLET | Freq: Four times a day (QID) | ORAL | Status: DC
  Administered 2017-05-30 – 2017-06-02 (×11): 650 mg via ORAL
  Filled 2017-05-30 (×12): qty 2

## 2017-05-30 MED ORDER — KETOROLAC TROMETHAMINE 30 MG/ML IJ SOLN
30.0000 mg | Freq: Four times a day (QID) | INTRAMUSCULAR | Status: AC
  Administered 2017-05-30 – 2017-06-01 (×6): 30 mg via INTRAVENOUS
  Filled 2017-05-30 (×7): qty 1

## 2017-05-30 NOTE — Progress Notes (Signed)
PROGRESS NOTE   Elishua Radford  ZOX:[id number]  DOB: 1980/12/08  DOA: 05/27/2017 PCP: Patient, No Pcp Per  Brief Admission Hx: 36 y.o. male with recent discharge from the hospital after suffering multiple read fractures after alcohol intoxication with fall, now presenting to the emergency department with redness, swelling on the left antecubital area.  MDM/Assessment & Plan:   MSSA Bacteremia / Sepsis - Source is likely septic thrombophlebitis of left cephalic vein - continue IV cefazolin 2gm every 6h, repeat BC done 6/23 no growth to date, Echocardiogram pending, Pt very concerned about risks associated with PICC line.   Pt reports improvement with antibiotic treatment.  CT left upper extremity was done to rule out abscess.  Appreciate assistance from ID specialists.   Recent fall with multiple rib fractures - CT Head, chest, abdomen and pelvis neg for bleeding issues  Chest x-ray negative for pneumothorax, stable rib fractures.  Pain control.  Continue to monitor.    Tobacco Abuse - counseling on cessation and nicotine patch.    Insomnia - trazodone 50 QHS prn  Hypokalemia - repleted, normal magnesium.   DVT prophylaxis: enoxaparin Code Status: Full  Family Communication: none present Disposition Plan: TBD  Consultants:  Infectious disease   Subjective: Pt having swelling and pain in left arm / elbow, less redness noted.    Objective: Vitals:   05/28/17 2044 05/29/17 0648 05/29/17 2133 05/30/17 0616  BP: 116/78 121/80 128/81 122/75  Pulse: 88 77 63 87  Resp: 19 19 18 18   Temp: 98.7 F (37.1 C) 98.1 F (36.7 C) 98.6 F (37 C) 99 F (37.2 C)  TempSrc: Oral Oral Oral   SpO2: 96% 97% 100% 95%  Weight:      Height:        Intake/Output Summary (Last 24 hours) at 05/30/17 0959 Last data filed at 05/30/17 8119  Gross per 24 hour  Intake               80 ml  Output             2535 ml  Net            -2455 ml   Filed Weights   05/26/17 2214 05/27/17 1453  Weight:  83.9 kg (185 lb) 83.9 kg (185 lb)    REVIEW OF SYSTEMS  As per history otherwise all reviewed and reported negative  Exam:  General exam: awake, alert, NAD.   Respiratory system: Clear. No increased work of breathing. Cardiovascular system: S1 & S2 heard, RRR. No JVD, murmurs, gallops, clicks or pedal edema. Gastrointestinal system: Abdomen is nondistended, soft and nontender. Normal bowel sounds heard. Central nervous system: Alert and oriented. No focal neurological deficits. Extremities: swollen painful, red left antecubital fossa with surrounding erythema, edema unchanged, limited ROM (due to pain).   Data Reviewed: Basic Metabolic Panel:  Recent Labs Lab 05/27/17 0057 05/28/17 0640 05/29/17 0538 05/29/17 1224 05/30/17 0443  NA 132* 132* 139  --  139  K 4.1 3.4* 3.2*  --  4.0  CL 98* 100* 105  --  107  CO2 21* 23 25  --  24  GLUCOSE 128* 105* 105*  --  83  BUN 15 6 7   --  7  CREATININE 1.02 0.94 0.88  --  0.81  CALCIUM 9.4 8.3* 8.3*  --  8.4*  MG  --   --   --  1.8 1.9   Liver Function Tests:  Recent Labs Lab 05/28/17 0640 05/29/17 1478  AST 36 30  ALT 31 32  ALKPHOS 66 72  BILITOT 1.1 0.4  PROT 6.1* 5.7*  ALBUMIN 2.9* 2.8*   No results for input(s): LIPASE, AMYLASE in the last 168 hours. No results for input(s): AMMONIA in the last 168 hours. CBC:  Recent Labs Lab 05/27/17 0057 05/28/17 0640 05/29/17 0538  WBC 15.8* 9.4 6.1  NEUTROABS 12.5*  --  3.2  HGB 16.4 12.8* 12.2*  HCT 47.4 37.8* 36.3*  MCV 83.0 82.2 82.7  PLT 255 232 229   Cardiac Enzymes: No results for input(s): CKTOTAL, CKMB, CKMBINDEX, TROPONINI in the last 168 hours. CBG (last 3)  No results for input(s): GLUCAP in the last 72 hours. Recent Results (from the past 240 hour(s))  Blood Culture (routine x 2)     Status: None (Preliminary result)   Collection Time: 05/27/17  6:21 AM  Result Value Ref Range Status   Specimen Description BLOOD RIGHT ANTECUBITAL  Final   Special  Requests   Final    BOTTLES DRAWN AEROBIC AND ANAEROBIC Blood Culture adequate volume   Culture NO GROWTH 2 DAYS  Final   Report Status PENDING  Incomplete  Blood Culture (routine x 2)     Status: Abnormal (Preliminary result)   Collection Time: 05/27/17  6:26 AM  Result Value Ref Range Status   Specimen Description BLOOD RIGHT HAND  Final   Special Requests IN PEDIATRIC BOTTLE Blood Culture adequate volume  Final   Culture  Setup Time   Final    IN PEDIATRIC BOTTLE GRAM POSITIVE COCCI IN CLUSTERS CRITICAL RESULT CALLED TO, READ BACK BY AND VERIFIED WITH: G.ABBOTT,PARMD 0400 05/28/17 M.CAMPBELL CRITICAL RESULT CALLED TO, READ BACK BY AND VERIFIED WITH: PHARMD T STONE [id number] MLM    Culture STAPHYLOCOCCUS AUREUS (A)  Final   Report Status PENDING  Incomplete   Organism ID, Bacteria STAPHYLOCOCCUS AUREUS  Final      Susceptibility   Staphylococcus aureus - MIC*    CIPROFLOXACIN <=0.5 SENSITIVE Sensitive     ERYTHROMYCIN RESISTANT Resistant     GENTAMICIN <=0.5 SENSITIVE Sensitive     OXACILLIN <=0.25 SENSITIVE Sensitive     TETRACYCLINE <=1 SENSITIVE Sensitive     VANCOMYCIN 1 SENSITIVE Sensitive     TRIMETH/SULFA <=10 SENSITIVE Sensitive     CLINDAMYCIN RESISTANT Resistant     RIFAMPIN <=0.5 SENSITIVE Sensitive     Inducible Clindamycin POSITIVE Resistant     * STAPHYLOCOCCUS AUREUS  Blood Culture ID Panel (Reflexed)     Status: Abnormal   Collection Time: 05/27/17  6:26 AM  Result Value Ref Range Status   Enterococcus species NOT DETECTED NOT DETECTED Final   Listeria monocytogenes NOT DETECTED NOT DETECTED Final   Staphylococcus species DETECTED (A) NOT DETECTED Final    Comment: CRITICAL RESULT CALLED TO, READ BACK BY AND VERIFIED WITH: PHARMD T STONE [id number] [phone] MLM    Staphylococcus aureus DETECTED (A) NOT DETECTED Final    Comment: Methicillin (oxacillin) susceptible Staphylococcus aureus (MSSA). Preferred therapy is anti staphylococcal beta lactam antibiotic  (Cefazolin or Nafcillin), unless clinically contraindicated. CRITICAL RESULT CALLED TO, READ BACK BY AND VERIFIED WITH: PHARMD T STONE [id number] MLM    Methicillin resistance NOT DETECTED NOT DETECTED Final   Streptococcus species NOT DETECTED NOT DETECTED Final   Streptococcus agalactiae NOT DETECTED NOT DETECTED Final   Streptococcus pneumoniae NOT DETECTED NOT DETECTED Final   Streptococcus pyogenes NOT DETECTED NOT DETECTED Final   Acinetobacter baumannii NOT DETECTED NOT DETECTED Final  Enterobacteriaceae species NOT DETECTED NOT DETECTED Final   Enterobacter cloacae complex NOT DETECTED NOT DETECTED Final   Escherichia coli NOT DETECTED NOT DETECTED Final   Klebsiella oxytoca NOT DETECTED NOT DETECTED Final   Klebsiella pneumoniae NOT DETECTED NOT DETECTED Final   Proteus species NOT DETECTED NOT DETECTED Final   Serratia marcescens NOT DETECTED NOT DETECTED Final   Haemophilus influenzae NOT DETECTED NOT DETECTED Final   Neisseria meningitidis NOT DETECTED NOT DETECTED Final   Pseudomonas aeruginosa NOT DETECTED NOT DETECTED Final   Candida albicans NOT DETECTED NOT DETECTED Final   Candida glabrata NOT DETECTED NOT DETECTED Final   Candida krusei NOT DETECTED NOT DETECTED Final   Candida parapsilosis NOT DETECTED NOT DETECTED Final   Candida tropicalis NOT DETECTED NOT DETECTED Final    Studies: Ct Extrem Up Entire Arm L W/cm  Result Date: 05/29/2017 CLINICAL DATA:  Cellulitis.  Septic thrombophlebitis. EXAM: CT OF THE UPPER LEFT EXTREMITY WITH CONTRAST TECHNIQUE: Multidetector CT imaging of the upper left extremity was performed according to the standard protocol following intravenous contrast administration. COMPARISON:  Radiographs dated 05/27/2017 CONTRAST:  75mL ISOVUE-300 IOPAMIDOL (ISOVUE-300) INJECTION 61% FINDINGS: Bones/Joint/Cartilage The visualized bones of the left arm are normal. Multiple left rib fractures. Muscles and Tendons Negative. Soft tissues Edema in the  the subcutaneous fat of the proximal forearm and around the antecubital fossa. There is no definable abscess. IMPRESSION: Findings consistent with cellulitis of left arm as described. No definable abscess, myositis, or osteomyelitis. The study is technically limited due to necessary patient position. Electronically Signed   By: Francene BoyersJames  Maxwell M.D.   On: 05/29/2017 08:19   Scheduled Meds: . acetaminophen  650 mg Oral Q6H  . enoxaparin (LOVENOX) injection  40 mg Subcutaneous Q24H  . methocarbamol  750 mg Oral TID  . nicotine  14 mg Transdermal Daily  . senna-docusate  2 tablet Oral BID   Continuous Infusions: .  ceFAZolin (ANCEF) IV Stopped (05/29/17 2328)   Principal Problem:   MSSA bacteremia Active Problems:   Multiple fractures of ribs, left side, initial encounter for closed fracture   Fall   Cellulitis of left arm   Sepsis (HCC)   Septic thrombophlebitis   Hypokalemia  Time spent:   Standley Dakinslanford Michaelle Bottomley, MD, FAAFP [facility] Pager [phone]  If 7PM-7AM, please contact night-coverage [email or web address] Password [email or web address] 05/30/2017, 9:59 AM    LOS: 3 days

## 2017-05-30 NOTE — Progress Notes (Signed)
  Echocardiogram 2D Echocardiogram has been performed.  Delcie RochENNINGTON, Harun Brumley 05/30/2017, 3:18 PM

## 2017-05-31 LAB — CULTURE, BLOOD (ROUTINE X 2): SPECIAL REQUESTS: ADEQUATE

## 2017-05-31 NOTE — Evaluation (Signed)
Physical Therapy Evaluation/ Discharge Patient Details Name: Douglas BaumannDavid Daniels MRN: [id number] DOB: [birth year] Today's Date: 05/31/2017   History of Present Illness  Pt is a 36 yo male admitted with LUE phlebitis with recent Left rib fx after fall. PMH for ETOH abuse.   Clinical Impression  Pt pleasant and reports that he is concerned with edema of LUE, positioning and transfers. Pt states he has been walking unit on his own without difficulty. Pt educated for ROM of LUE with assist from RUE as needed, positioning particularly after ambulation to decrease edema of LUE, sequence for bed mobility for side<>sit, splinting of Left side with pillow with transfer and bed positioning for pain and edema. Pt demonstrated/ verbalized understanding of all education, is mod I for all mobility and does not require further therapy intervention at this time with pt aware and agreeable, will sign off.     Follow Up Recommendations No PT follow up    Equipment Recommendations  None recommended by PT    Recommendations for Other Services       Precautions / Restrictions Precautions Precautions: None      Mobility  Bed Mobility Overal bed mobility: Modified Independent             General bed mobility comments: pt initially pivoting to EOB with HOB 30 degrees with report of painful left chest. With multimodal cues pt performed sit<>sidely to right x 2 trials with decreased pain and use of pillow under LUE to splint chest  Transfers Overall transfer level: Modified independent                  Ambulation/Gait             General Gait Details: pt declined ambulation as he has been walking halls unassisted  Information systems managertairs            Wheelchair Mobility    Modified Rankin (Stroke Patients Only)       Balance Overall balance assessment: No apparent balance deficits (not formally assessed)                                           Pertinent Vitals/Pain Pain  Score: 4  Pain Location: left chest Pain Descriptors / Indicators: Sore Pain Intervention(s): Limited activity within patient's tolerance;Premedicated before session;Repositioned    Home Living Family/patient expects to be discharged to:: Private residence Living Arrangements: Alone Available Help at Discharge: Friend(s);Available PRN/intermittently Type of Home: House Home Access: Stairs to enter Entrance Stairs-Rails: None Entrance Stairs-Number of Steps: 3 Home Layout: Two level        Prior Function Level of Independence: Independent         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, driver, Oncologistshop manager     Hand Dominance   Dominant Hand: Right    Extremity/Trunk Assessment   Upper Extremity Assessment Upper Extremity Assessment: LUE deficits/detail LUE Deficits / Details: decreased shoulder ROM due to pain, able to achieve full ROM with AAROM    Lower Extremity Assessment Lower Extremity Assessment: Overall WFL for tasks assessed    Cervical / Trunk Assessment Cervical / Trunk Assessment: Normal  Communication   Communication: No difficulties  Cognition Arousal/Alertness: Awake/alert Behavior During Therapy: WFL for tasks assessed/performed Overall Cognitive Status: Within Functional Limits for tasks assessed  General Comments      Exercises General Exercises - Upper Extremity Shoulder Flexion: AAROM;Left;Seated;5 reps Shoulder ABduction: AAROM;Left;Seated;5 reps   Assessment/Plan    PT Assessment Patent does not need any further PT services  PT Problem List         PT Treatment Interventions      PT Goals (Current goals can be found in the Care Plan section)  Acute Rehab PT Goals PT Goal Formulation: All assessment and education complete, DC therapy    Frequency     Barriers to discharge        Co-evaluation               AM-PAC PT "6 Clicks" Daily Activity  Outcome Measure  Difficulty turning over in bed (including adjusting bedclothes, sheets and blankets)?: A Little Difficulty moving from lying on back to sitting on the side of the bed? : A Little Difficulty sitting down on and standing up from a chair with arms (e.g., wheelchair, bedside commode, etc,.)?: None Help needed moving to and from a bed to chair (including a wheelchair)?: None Help needed walking in hospital room?: None Help needed climbing 3-5 steps with a railing? : None 6 Click Score: 22    End of Session   Activity Tolerance: Patient tolerated treatment well Patient left: in bed;with call bell/phone within reach   PT Visit Diagnosis: Muscle weakness (generalized) (M62.81)    Time: 1610-9604 PT Time Calculation (min) (ACUTE ONLY): 22 min   Charges:   PT Evaluation $PT Eval Low Complexity: 1 Procedure     PT G Codes:        Douglas Daniels, PT [phone]   Douglas Daniels 05/31/2017, 10:26 AM

## 2017-05-31 NOTE — Progress Notes (Signed)
PROGRESS NOTE   Douglas Daniels  JXB:[id number]  DOB: 1981/09/06  DOA: 05/27/2017 PCP: Patient, No Pcp Per  Brief Admission Hx: 36 y.o. male with recent discharge from the hospital after suffering multiple read fractures after alcohol intoxication with fall, now presenting to the emergency department with redness, swelling on the left antecubital area.  MDM/Assessment & Plan:   MSSA Bacteremia / Sepsis - Source is likely septic thrombophlebitis of left cephalic vein - continue IV cefazolin 2gm every 6h, repeat BC done 6/23 no growth to date, Echocardiogram no vegetations, Pt very concerned about risks associated with PICC line but I spoke with him again today and he expressed that he likely will consent to doing this.   Pt reports improvement with antibiotic treatment.  CT left upper extremity was done to rule out abscess.  Appreciate assistance from ID specialists.   Small superficial abscess left antecubital fossa - spontaneously started draining yesterday, I was able to express a little bit more pus today and sent to lab for culture.  Pt didn't want to have surgery evaluate it for I&D today but would reconsider tomorrow if continues to swell.   Recent fall with multiple rib fractures - CT Head, chest, abdomen and pelvis neg for bleeding issues  Chest x-ray negative for pneumothorax, stable rib fractures.  Pain control.  Continue to monitor.    Tobacco Abuse - counseling on cessation and nicotine patch.    Insomnia - trazodone 50 QHS prn  Hypokalemia - repleted, normal magnesium.   DVT prophylaxis: enoxaparin Code Status: Full  Family Communication: none present Disposition Plan: TBD  Consultants:  Infectious disease   Subjective: Pt had drainage from left antecubital fossa, still painful but was working with PT yesterday.    Objective: Vitals:   05/30/17 0616 05/30/17 1411 05/30/17 2334 05/31/17 0647  BP: 122/75 115/78 124/70 106/72  Pulse: 87 78 (!) 59 61  Resp: 18 16 18  18   Temp: 99 F (37.2 C) 98 F (36.7 C) 98.6 F (37 C) 98.2 F (36.8 C)  TempSrc:  Oral Oral   SpO2: 95% 96% 96% 95%  Weight:      Height:        Intake/Output Summary (Last 24 hours) at 05/31/17 1034 Last data filed at 05/31/17 0600  Gross per 24 hour  Intake             1184 ml  Output              950 ml  Net              234 ml   Filed Weights   05/26/17 2214 05/27/17 1453  Weight: 83.9 kg (185 lb) 83.9 kg (185 lb)    REVIEW OF SYSTEMS  As per history otherwise all reviewed and reported negative  Exam:  General exam: awake, alert, NAD.   Respiratory system: Clear. No increased work of breathing. Cardiovascular system: S1 & S2 heard, RRR. No JVD, murmurs, gallops, clicks or pedal edema. Gastrointestinal system: Abdomen is nondistended, soft and nontender. Normal bowel sounds heard. Central nervous system: Alert and oriented. No focal neurological deficits. Extremities: swollen painful, red left antecubital fossa with surrounding erythema, draining of yellow pus seen, edema unchanged, limited ROM (due to pain).       Data Reviewed: Basic Metabolic Panel:  Recent Labs Lab 05/27/17 0057 05/28/17 0640 05/29/17 0538 05/29/17 1224 05/30/17 0443  NA 132* 132* 139  --  139  K 4.1 3.4* 3.2*  --  4.0  CL 98* 100* 105  --  107  CO2 21* 23 25  --  24  GLUCOSE 128* 105* 105*  --  83  BUN 15 6 7   --  7  CREATININE 1.02 0.94 0.88  --  0.81  CALCIUM 9.4 8.3* 8.3*  --  8.4*  MG  --   --   --  1.8 1.9   Liver Function Tests:  Recent Labs Lab 05/28/17 0640 05/29/17 0538  AST 36 30  ALT 31 32  ALKPHOS 66 72  BILITOT 1.1 0.4  PROT 6.1* 5.7*  ALBUMIN 2.9* 2.8*   No results for input(s): LIPASE, AMYLASE in the last 168 hours. No results for input(s): AMMONIA in the last 168 hours. CBC:  Recent Labs Lab 05/27/17 0057 05/28/17 0640 05/29/17 0538  WBC 15.8* 9.4 6.1  NEUTROABS 12.5*  --  3.2  HGB 16.4 12.8* 12.2*  HCT 47.4 37.8* 36.3*  MCV 83.0 82.2 82.7   PLT 255 232 229   Cardiac Enzymes: No results for input(s): CKTOTAL, CKMB, CKMBINDEX, TROPONINI in the last 168 hours. CBG (last 3)  No results for input(s): GLUCAP in the last 72 hours. Recent Results (from the past 240 hour(s))  Blood Culture (routine x 2)     Status: None (Preliminary result)   Collection Time: 05/27/17  6:21 AM  Result Value Ref Range Status   Specimen Description BLOOD RIGHT ANTECUBITAL  Final   Special Requests   Final    BOTTLES DRAWN AEROBIC AND ANAEROBIC Blood Culture adequate volume   Culture NO GROWTH 3 DAYS  Final   Report Status PENDING  Incomplete  Blood Culture (routine x 2)     Status: Abnormal   Collection Time: 05/27/17  6:26 AM  Result Value Ref Range Status   Specimen Description BLOOD RIGHT HAND  Final   Special Requests IN PEDIATRIC BOTTLE Blood Culture adequate volume  Final   Culture  Setup Time   Final    IN PEDIATRIC BOTTLE GRAM POSITIVE COCCI IN CLUSTERS CRITICAL RESULT CALLED TO, READ BACK BY AND VERIFIED WITH: G.ABBOTT,PARMD 0400 05/28/17 M.CAMPBELL CRITICAL RESULT CALLED TO, READ BACK BY AND VERIFIED WITH: PHARMD T STONE [phone] MLM    Culture STAPHYLOCOCCUS AUREUS (A)  Final   Report Status 05/31/2017 FINAL  Final   Organism ID, Bacteria STAPHYLOCOCCUS AUREUS  Final      Susceptibility   Staphylococcus aureus - MIC*    CIPROFLOXACIN <=0.5 SENSITIVE Sensitive     ERYTHROMYCIN RESISTANT Resistant     GENTAMICIN <=0.5 SENSITIVE Sensitive     OXACILLIN <=0.25 SENSITIVE Sensitive     TETRACYCLINE <=1 SENSITIVE Sensitive     VANCOMYCIN 1 SENSITIVE Sensitive     TRIMETH/SULFA <=10 SENSITIVE Sensitive     CLINDAMYCIN RESISTANT Resistant     RIFAMPIN <=0.5 SENSITIVE Sensitive     Inducible Clindamycin POSITIVE Resistant     * STAPHYLOCOCCUS AUREUS  Blood Culture ID Panel (Reflexed)     Status: Abnormal   Collection Time: 05/27/17  6:26 AM  Result Value Ref Range Status   Enterococcus species NOT DETECTED NOT DETECTED Final    Listeria monocytogenes NOT DETECTED NOT DETECTED Final   Staphylococcus species DETECTED (A) NOT DETECTED Final    Comment: CRITICAL RESULT CALLED TO, READ BACK BY AND VERIFIED WITH: PHARMD T STONE [phone] MLM    Staphylococcus aureus DETECTED (A) NOT DETECTED Final    Comment: Methicillin (oxacillin) susceptible Staphylococcus aureus (MSSA). Preferred therapy is anti  staphylococcal beta lactam antibiotic (Cefazolin or Nafcillin), unless clinically contraindicated. CRITICAL RESULT CALLED TO, READ BACK BY AND VERIFIED WITH: PHARMD T STONE [phone] MLM    Methicillin resistance NOT DETECTED NOT DETECTED Final   Streptococcus species NOT DETECTED NOT DETECTED Final   Streptococcus agalactiae NOT DETECTED NOT DETECTED Final   Streptococcus pneumoniae NOT DETECTED NOT DETECTED Final   Streptococcus pyogenes NOT DETECTED NOT DETECTED Final   Acinetobacter baumannii NOT DETECTED NOT DETECTED Final   Enterobacteriaceae species NOT DETECTED NOT DETECTED Final   Enterobacter cloacae complex NOT DETECTED NOT DETECTED Final   Escherichia coli NOT DETECTED NOT DETECTED Final   Klebsiella oxytoca NOT DETECTED NOT DETECTED Final   Klebsiella pneumoniae NOT DETECTED NOT DETECTED Final   Proteus species NOT DETECTED NOT DETECTED Final   Serratia marcescens NOT DETECTED NOT DETECTED Final   Haemophilus influenzae NOT DETECTED NOT DETECTED Final   Neisseria meningitidis NOT DETECTED NOT DETECTED Final   Pseudomonas aeruginosa NOT DETECTED NOT DETECTED Final   Candida albicans NOT DETECTED NOT DETECTED Final   Candida glabrata NOT DETECTED NOT DETECTED Final   Candida krusei NOT DETECTED NOT DETECTED Final   Candida parapsilosis NOT DETECTED NOT DETECTED Final   Candida tropicalis NOT DETECTED NOT DETECTED Final  Culture, blood (routine x 2)     Status: None (Preliminary result)   Collection Time: 05/29/17  6:18 AM  Result Value Ref Range Status   Specimen Description BLOOD BLOOD RIGHT HAND   Final   Special Requests   Final    BOTTLES DRAWN AEROBIC AND ANAEROBIC Blood Culture results may not be optimal due to an excessive volume of blood received in culture bottles   Culture NO GROWTH 1 DAY  Final   Report Status PENDING  Incomplete  Culture, blood (routine x 2)     Status: None (Preliminary result)   Collection Time: 05/29/17  6:26 AM  Result Value Ref Range Status   Specimen Description BLOOD BLOOD LEFT HAND  Final   Special Requests   Final    BOTTLES DRAWN AEROBIC AND ANAEROBIC Blood Culture results may not be optimal due to an excessive volume of blood received in culture bottles   Culture NO GROWTH 1 DAY  Final   Report Status PENDING  Incomplete    Studies: No results found. Scheduled Meds: . acetaminophen  650 mg Oral Q6H  . enoxaparin (LOVENOX) injection  40 mg Subcutaneous Q24H  . ketorolac  30 mg Intravenous Q6H  . methocarbamol  750 mg Oral TID  . nicotine  14 mg Transdermal Daily  . senna-docusate  2 tablet Oral BID   Continuous Infusions: .  ceFAZolin (ANCEF) IV Stopped (05/31/17 0549)   Principal Problem:   MSSA bacteremia Active Problems:   Multiple fractures of ribs, left side, initial encounter for closed fracture   Fall   Cellulitis of left arm   Sepsis (HCC)   Septic thrombophlebitis   Hypokalemia  Time spent:   Standley Dakins, MD, FAAFP [facility] Pager [phone]  If 7PM-7AM, please contact night-coverage [email or web address] Password [email or web address] 05/31/2017, 10:34 AM    LOS: 4 days

## 2017-06-01 LAB — CULTURE, BLOOD (ROUTINE X 2)
CULTURE: NO GROWTH
Special Requests: ADEQUATE

## 2017-06-01 NOTE — Consult Note (Signed)
[facility] Surgery Consult/Admission Note  Lester Crickenberger May 17, 1981  [id number].    Requesting MD: Dr. Standley Dakins  Chief Complaint/Reason for Consult: Thrombophlebitis of L AC.   HPI: Pt is a 36 y.o who presented to the [facility] on 05/27/17 for pain and swelling of L arm with erythema and TTP at site of previous IV from hospital admission on 05/15/17. Pt was admitted to the hospital for rib fractures after falling at ground level from alcohol intoxication. He was discharged from the hospital on 05/18/17 and reports the pain and swelling in his arm began 2 days later. The pt presented to the ED for the first time on 05/24/17 and was discharged with instructions to take ibuprofen, use warm compresses, and elevate his arm. On 05/27/17 the pt returned to the ED for L arm pain, swelling, and chills which lead to his current admission for IV abx. He has been treated for septic thrombophlebitis with IV cefazolin with decreasing erythema, edema, and pain. His L AC began spontaneously draining yesterday from the previous IV site, today the drainage is a serous-sanguineous fluid. He denies F, chills today.   ROS: Review of Systems  Constitutional: Negative for chills and fever.  Gastrointestinal: Negative for nausea.  Musculoskeletal: Negative for joint pain.       Limited ROM secondary to pain.  Skin:       L AC erythema and TTP with surrounding edema.  Neurological: Negative for sensory change and focal weakness.   History reviewed. No pertinent family history.  Past Medical History:  Diagnosis Date  . Cellulitis 05/2017   left arm  . Fall 05/18/2017  . Rib fractures 05/2017    Past Surgical History:  Procedure Laterality Date  . NO PAST SURGERIES     Social History:  reports that he has been smoking Cigarettes.  He has a 15.00 pack-year smoking history. He has never used smokeless tobacco. He reports that he drinks alcohol. He reports that he does not use drugs.  Allergies: No Known  Allergies  Medications Prior to Admission  Medication Sig Dispense Refill  . ibuprofen (ADVIL,MOTRIN) 600 MG tablet Take 1 tablet (600 mg total) by mouth every 6 (six) hours as needed. (Patient taking differently: Take 600 mg by mouth every 6 (six) hours as needed for moderate pain. ) 30 tablet 0  . methocarbamol (ROBAXIN) 750 MG tablet Take 1 tablet (750 mg total) by mouth 3 (three) times daily. 30 tablet 1  . Oxycodone HCl 10 MG TABS Take 1 tablet (10 mg total) by mouth every 4 (four) hours as needed. (Patient taking differently: Take 10 mg by mouth every 4 (four) hours as needed (pain). ) 40 tablet 0    Blood pressure 119/70, pulse 66, temperature 98.2 F (36.8 C), temperature source Oral, resp. rate 19, height 6' (1.829 m), weight 185 lb (83.9 kg), SpO2 96 %.  Physical Exam  Constitutional: He is oriented to person, place, and time and well-developed, well-nourished, and in no distress.  HENT:  Head: Normocephalic and atraumatic.  Pulmonary/Chest: Effort normal.  Musculoskeletal:       Left elbow: He exhibits decreased range of motion (slightly decreased active ROM d/t pain, full passive ROM) and swelling. Tenderness found.  Erythema with induration ~4cm proximally, 6 cm distally and 5 cm diameter, no fluctuance. No bony tenderness.   Neurological: He is alert and oriented to person, place, and time. No sensory deficit.    Results for orders placed or performed during the hospital encounter  of 05/27/17 (from the past 48 hour(s))  Aerobic Culture (superficial specimen)     Status: None (Preliminary result)   Collection Time: 05/31/17 10:30 AM  Result Value Ref Range   Specimen Description WOUND LEFT    Special Requests ELBOW    Gram Stain      RARE WBC PRESENT, PREDOMINANTLY PMN RARE GRAM POSITIVE COCCI IN PAIRS    Culture PENDING    Report Status PENDING    No results found.  Assessment/Plan L arm thrombophlebitis  - WBC (05/29/17) 6.1, x2 blood cx negative - final results  05/29/17 - CT LUE: Findings consistent with cellulitis of left arm as described. No definable abscess, myositis, or osteomyelitis - IV cefazolin, continue warm compresses with elevation  - No acute surgical needs, or indication for incision and drainage.   Vanice SarahLauren Chrisandra Wiemers, PA-S [facility] 06/01/2017, 12:59 PM Consults: [phone] Mon-Fri 7:00 am-4:30 pm Sat-Sun 7:00 am-11:30 am

## 2017-06-01 NOTE — Progress Notes (Addendum)
    [facility]    Date of Admission:  05/27/2017   Total days of antibiotics 7        Day 5 cefazolin          ID: Andris BaumannDavid Roupp is a 36 y.o. male with bacteremia and left arm cellulitis Principal Problem:   MSSA bacteremia Active Problems:   Multiple fractures of ribs, left side, initial encounter for closed fracture   Fall   Cellulitis of left arm   Sepsis (HCC)   Septic thrombophlebitis   Hypokalemia    Subjective: afebrile  Medications:  . acetaminophen  650 mg Oral Q6H  . enoxaparin (LOVENOX) injection  40 mg Subcutaneous Q24H  . methocarbamol  750 mg Oral TID  . nicotine  14 mg Transdermal Daily  . senna-docusate  2 tablet Oral BID    Objective: Vital signs in last 24 hours: Temp:  [98.2 F (36.8 C)] 98.2 F (36.8 C) (06/26 0425) Pulse Rate:  [64-66] 66 (06/26 0425) Resp:  [19] 19 (06/26 0425) BP: (118-119)/(70-74) 119/70 (06/26 0425) SpO2:  [95 %-96 %] 96 % (06/26 0425)  Not in room   Lab Results  Recent Labs  05/30/17 0443  NA 139  K 4.0  CL 107  CO2 24  BUN 7  CREATININE 0.81   Liver Panel No results for input(s): PROT, ALBUMIN, AST, ALT, ALKPHOS, BILITOT, BILIDIR, IBILI in the last 72 hours. Sedimentation Rate No results for input(s): ESRSEDRATE in the last 72 hours. C-Reactive Protein No results for input(s): CRP in the last 72 hours.  Microbiology: 6/23 blood cx ngtd 6/25 left elbow wound -few staph aureus 6/21 blood cx 1 of 2 sets MSSA Studies/Results: No results found.   Assessment/Plan: mssa bacteremia 2/2 septic thrombophlebitis of left cephalic vein = plan for 2 wk of cefazolin via picc line using 6/23 as day 1.   Addendum: patient states he is unable to do infusion on his own. Patient does not qualify for charity care and would have high out of pocket cost.   Alternatively, could have him see if he qualifies for match program to get 2 wk of oral linezolid 600mg  PO BID   Diagnosis: MSSA  bacteremia  Culture Result: MSSA  No Known Allergies  OPAT Orders Discharge antibiotics: Per pharmacy protocol  cefazolin  Duration: 14 day End Date: July 7th  [facility] Per Protocol:  Labs weekly while on IV antibiotics: x__ CBC with differential _x_ BMP   __x Please pull PIC at completion of IV antibiotics   Fax weekly labs to [phone]     [facility]ilionNID[facility], [facility]NTH[facility] Cell: [phone] Pager: [phone]  06/01/2017, 12:25 PM
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
## 2017-06-01 NOTE — Care Management Note (Signed)
Case Management Note  Patient Details  Name: Andris BaumannDavid Garguilo MRN: [id number] Date of Birth: 1981-11-26  Subjective/Objective:                    Action/Plan: Confirmed face sheet information with patient. Discussed home IV ABX and PICC line with patient . Explained he will be taught how to administer IV AXB , HHRN will not be there everytime a dose is due. Patient voiced understanding.  Patient has hospital follow up appointment with DR Sindy Messingoger Gomez on 06/07/17 at 1430.      Expected Discharge Date:                  Expected Discharge Plan:  Home w Home Health Services  In-House Referral:     Discharge planning Services  CM Consult, Medication Assistance, Indigent Health Clinic  Post Acute Care Choice:    Choice offered to:  Patient  DME Arranged:    DME Agency:     HH Arranged:  RN HH Agency:  [facility]  Status of Service:  In process, will continue to follow  If discussed at Long Length of Stay Meetings, dates discussed:    Additional Comments:  Kingsley PlanWile, Miosha Behe Marie, RN 06/01/2017, 2:16 PM

## 2017-06-01 NOTE — Progress Notes (Signed)
PHARMACY CONSULT NOTE FOR:  OUTPATIENT  PARENTERAL ANTIBIOTIC THERAPY (OPAT)  Indication: MSSA bacteremia + left antecubital fossa abscess Regimen: Ancef 2gm IV Q8H End date: 06/11/17  IV antibiotic discharge orders are pended. To discharging provider:  please sign these orders via discharge navigator,  Select New Orders & click on the button choice - Manage This Unsigned Work.     Thank you for allowing pharmacy to be a part of this patient's care.   Nate Common D. Laney Potashang, PharmD, BCPS Pager:  [phone] 06/01/2017, 12:28 PM

## 2017-06-02 ENCOUNTER — Encounter (HOSPITAL_COMMUNITY): Payer: Self-pay

## 2017-06-02 DIAGNOSIS — L03114 Cellulitis of left upper limb: Secondary | ICD-10-CM

## 2017-06-02 DIAGNOSIS — S2242XA Multiple fractures of ribs, left side, initial encounter for closed fracture: Secondary | ICD-10-CM

## 2017-06-02 DIAGNOSIS — I809 Phlebitis and thrombophlebitis of unspecified site: Secondary | ICD-10-CM

## 2017-06-02 DIAGNOSIS — R7881 Bacteremia: Secondary | ICD-10-CM

## 2017-06-02 LAB — AEROBIC CULTURE  (SUPERFICIAL SPECIMEN)

## 2017-06-02 LAB — AEROBIC CULTURE W GRAM STAIN (SUPERFICIAL SPECIMEN)

## 2017-06-02 MED ORDER — LINEZOLID 600 MG PO TABS: 600.0000 mg | ORAL_TABLET | Freq: Two times a day (BID) | ORAL | 0 refills | Status: AC

## 2017-06-02 MED ORDER — LINEZOLID 600 MG PO TABS
600.0000 mg | ORAL_TABLET | Freq: Two times a day (BID) | ORAL | Status: DC
  Administered 2017-06-02: 600 mg via ORAL
  Filled 2017-06-02 (×2): qty 1

## 2017-06-02 MED ORDER — OXYCODONE HCL 10 MG PO TABS: 10.0000 mg | ORAL_TABLET | ORAL | 0 refills | Status: DC | PRN

## 2017-06-02 MED ORDER — METHOCARBAMOL 750 MG PO TABS: 750.0000 mg | ORAL_TABLET | Freq: Three times a day (TID) | ORAL | 0 refills | Status: AC | PRN

## 2017-06-02 MED ORDER — IBUPROFEN 400 MG PO TABS
400.0000 mg | ORAL_TABLET | Freq: Three times a day (TID) | ORAL | Status: DC
  Administered 2017-06-02: 400 mg via ORAL
  Filled 2017-06-02: qty 1

## 2017-06-02 NOTE — Discharge Summary (Signed)
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 IOPAMIDOL (ISOVUE-300) INJECTION 61% FINDINGS: Bones/Joint/Cartilage The visualized bones of the left arm are normal. Multiple left rib fractures. Muscles and Tendons Negative. Soft tissues Edema in the the subcutaneous fat of the proximal forearm and around the antecubital fossa. There is no definable abscess. IMPRESSION: Findings consistent with cellulitis of left arm as described. No definable abscess, myositis, or osteomyelitis. The study is technically limited due to necessary patient position. Electronically Signed   By: Francene Boyers M.D.   On: 05/29/2017 08:19    ECHO 05/30/17 ------------------------------------------------------------------- Study Conclusions  - Left ventricle: The cavity size was normal. Wall thickness was   normal. Systolic function was normal. The estimated ejection   fraction was in the range of 55% to 60%. Wall motion was normal;   there were no regional wall motion abnormalities. Left   ventricular diastolic function parameters were  normal. - Aortic valve: There was no stenosis. - Mitral valve: There was trivial regurgitation. - Right ventricle: The cavity size was normal. Systolic function   was normal. - Inferior vena cava: The vessel was normal in size. The   respirophasic diameter changes were in the normal range (>= 50%),   consistent with normal central venous pressure. - Pericardium, extracardiac: There was a left-sided pleural   effusion.  Impressions:  - Normal LV size with EF 55-60%. Normal diastolic function. Normal   RV size and systolic function. No significant valvular   abnormalities.   Discharge Exam: Vitals:   06/01/17 2108 06/02/17 0431  BP: 128/84 125/80  Pulse: 78 65  Resp: 18 18  Temp: 98.5 F (36.9 C) 98.4 F (36.9 C)   Vitals:   06/01/17 0425 06/01/17 1617 06/01/17 2108 06/02/17 0431  BP: 119/70 128/82 128/84 125/80  Pulse: 66 60 78 65  Resp: 19 18 18 18   Temp: 98.2 F (36.8 C) 99.1 F (37.3 C) 98.5 F (36.9 C) 98.4 F (36.9 C)  TempSrc: Oral Oral Oral Oral  SpO2: 96% 97% 98% 94%  Weight:      Height:        General: Pt is alert, awake, not in acute distress Cardiovascular: RRR, S1/S2 +, no rubs, no gallops Respiratory: CTA bilaterally, no wheezing, no rhonchi Abdominal: Soft, NT, ND, bowel sounds + Extremities: Mild erythema  on left antecubital fossa, mild swollen. Non tenderness.    The results of significant diagnostics from this hospitalization (including imaging, microbiology, ancillary and laboratory) are listed below for reference.     Microbiology: Recent Results (from the past 240 hour(s))  Blood Culture (routine x 2)     Status: None   Collection Time: 05/27/17  6:21 AM  Result Value Ref Range Status   Specimen Description BLOOD RIGHT ANTECUBITAL  Final   Special Requests   Final    BOTTLES DRAWN AEROBIC AND ANAEROBIC Blood Culture adequate volume   Culture NO GROWTH 5 DAYS  Final   Report Status 06/01/2017 FINAL  Final  Blood Culture (routine x  2)     Status: Abnormal   Collection Time: 05/27/17  6:26 AM  Result Value Ref Range Status   Specimen Description BLOOD RIGHT HAND  Final   Special Requests IN PEDIATRIC BOTTLE Blood Culture adequate volume  Final   Culture  Setup Time   Final    IN PEDIATRIC BOTTLE GRAM POSITIVE COCCI IN CLUSTERS CRITICAL RESULT CALLED TO, READ BACK BY AND VERIFIED WITH: G.ABBOTT,PARMD 0400 05/28/17 M.CAMPBELL CRITICAL RESULT CALLED TO, READ BACK BY AND VERIFIED WITH: PHARMD T STONE [id number] MLM    Culture STAPHYLOCOCCUS AUREUS (A)  Final   Report Status 05/31/2017 FINAL  Final   Organism ID, Bacteria STAPHYLOCOCCUS AUREUS  Final      Susceptibility   Staphylococcus aureus - MIC*    CIPROFLOXACIN <=0.5 SENSITIVE Sensitive     ERYTHROMYCIN RESISTANT Resistant     GENTAMICIN <=0.5 SENSITIVE Sensitive     OXACILLIN <=0.25 SENSITIVE Sensitive     TETRACYCLINE <=1 SENSITIVE Sensitive     VANCOMYCIN 1 SENSITIVE Sensitive     TRIMETH/SULFA <=10 SENSITIVE Sensitive     CLINDAMYCIN RESISTANT Resistant     RIFAMPIN <=0.5 SENSITIVE Sensitive     Inducible Clindamycin POSITIVE Resistant     * STAPHYLOCOCCUS AUREUS  Blood Culture ID Panel (Reflexed)     Status: Abnormal   Collection Time: 05/27/17  6:26 AM  Result Value Ref Range Status   Enterococcus species NOT DETECTED NOT DETECTED Final   Listeria monocytogenes NOT DETECTED NOT DETECTED Final   Staphylococcus species DETECTED (A) NOT DETECTED Final    Comment: CRITICAL RESULT CALLED TO, READ BACK BY AND VERIFIED WITH: PHARMD T STONE [id number] MLM    Staphylococcus aureus DETECTED (A) NOT DETECTED Final    Comment: Methicillin (oxacillin) susceptible Staphylococcus aureus (MSSA). Preferred therapy is anti staphylococcal beta lactam antibiotic (Cefazolin or Nafcillin), unless clinically contraindicated. CRITICAL RESULT CALLED TO, READ BACK BY AND VERIFIED WITH: PHARMD T STONE [id number] MLM    Methicillin resistance NOT DETECTED NOT DETECTED  Final   Streptococcus species NOT DETECTED NOT DETECTED Final   Streptococcus agalactiae NOT DETECTED NOT DETECTED Final   Streptococcus pneumoniae NOT DETECTED NOT DETECTED Final   Streptococcus pyogenes NOT DETECTED NOT DETECTED Final   Acinetobacter baumannii NOT DETECTED NOT DETECTED Final   Enterobacteriaceae species NOT DETECTED NOT DETECTED Final   Enterobacter cloacae complex NOT DETECTED NOT DETECTED Final   Escherichia coli NOT DETECTED NOT DETECTED Final   Klebsiella oxytoca NOT DETECTED NOT DETECTED Final   Klebsiella pneumoniae NOT DETECTED NOT DETECTED Final   Proteus species NOT DETECTED NOT DETECTED Final   Serratia marcescens NOT DETECTED NOT DETECTED Final   Haemophilus influenzae NOT DETECTED NOT DETECTED Final   Neisseria meningitidis NOT DETECTED NOT DETECTED Final   Pseudomonas aeruginosa NOT  DETECTED NOT DETECTED Final   Candida albicans NOT DETECTED NOT DETECTED Final   Candida glabrata NOT DETECTED NOT DETECTED Final   Candida krusei NOT DETECTED NOT DETECTED Final   Candida parapsilosis NOT DETECTED NOT DETECTED Final   Candida tropicalis NOT DETECTED NOT DETECTED Final  Culture, blood (routine x 2)     Status: None (Preliminary result)   Collection Time: 05/29/17  6:18 AM  Result Value Ref Range Status   Specimen Description BLOOD BLOOD RIGHT HAND  Final   Special Requests   Final    BOTTLES DRAWN AEROBIC AND ANAEROBIC Blood Culture results may not be optimal due to an excessive volume of blood received in culture bottles   Culture NO GROWTH 3 DAYS  Final   Report Status PENDING  Incomplete  Culture, blood (routine x 2)     Status: None (Preliminary result)   Collection Time: 05/29/17  6:26 AM  Result Value Ref Range Status   Specimen Description BLOOD BLOOD LEFT HAND  Final   Special Requests   Final    BOTTLES DRAWN AEROBIC AND ANAEROBIC Blood Culture results may not be optimal due to an excessive volume of blood received in culture bottles   Culture NO  GROWTH 3 DAYS  Final   Report Status PENDING  Incomplete  Aerobic Culture (superficial specimen)     Status: None (Preliminary result)   Collection Time: 05/31/17 10:30 AM  Result Value Ref Range Status   Specimen Description WOUND LEFT  Final   Special Requests ELBOW  Final   Gram Stain   Final    RARE WBC PRESENT, PREDOMINANTLY PMN RARE GRAM POSITIVE COCCI IN PAIRS    Culture   Final    FEW STAPHYLOCOCCUS AUREUS SUSCEPTIBILITIES TO FOLLOW    Report Status PENDING  Incomplete     Labs: BNP (last 3 results) No results for input(s): BNP in the last 8760 hours. Basic Metabolic Panel:  Recent Labs Lab 05/27/17 0057 05/28/17 0640 05/29/17 0538 05/29/17 1224 05/30/17 0443  NA 132* 132* 139  --  139  K 4.1 3.4* 3.2*  --  4.0  CL 98* 100* 105  --  107  CO2 21* 23 25  --  24  GLUCOSE 128* 105* 105*  --  83  BUN 15 6 7   --  7  CREATININE 1.02 0.94 0.88  --  0.81  CALCIUM 9.4 8.3* 8.3*  --  8.4*  MG  --   --   --  1.8 1.9   Liver Function Tests:  Recent Labs Lab 05/28/17 0640 05/29/17 0538  AST 36 30  ALT 31 32  ALKPHOS 66 72  BILITOT 1.1 0.4  PROT 6.1* 5.7*  ALBUMIN 2.9* 2.8*   No results for input(s): LIPASE, AMYLASE in the last 168 hours. No results for input(s): AMMONIA in the last 168 hours. CBC:  Recent Labs Lab 05/27/17 0057 05/28/17 0640 05/29/17 0538  WBC 15.8* 9.4 6.1  NEUTROABS 12.5*  --  3.2  HGB 16.4 12.8* 12.2*  HCT 47.4 37.8* 36.3*  MCV 83.0 82.2 82.7  PLT 255 232 229   Cardiac Enzymes: No results for input(s): CKTOTAL, CKMB, CKMBINDEX, TROPONINI in the last 168 hours. BNP: Invalid input(s): POCBNP CBG: No results for input(s): GLUCAP in the last 168 hours. D-Dimer No results for input(s): DDIMER in the last 72 hours. Hgb A1c No results for input(s): HGBA1C in the last 72 hours. Lipid Profile No results for input(s): CHOL, HDL, LDLCALC, TRIG, CHOLHDL,  LDLDIRECT in the last 72 hours. Thyroid function studies No results for  input(s): TSH, T4TOTAL, T3FREE, THYROIDAB in the last 72 hours.  Invalid input(s): FREET3 Anemia work up No results for input(s): VITAMINB12, FOLATE, FERRITIN, TIBC, IRON, RETICCTPCT in the last 72 hours. Urinalysis    Component Value Date/Time   COLORURINE AMBER (A) 05/27/2017 0209   APPEARANCEUR HAZY (A) 05/27/2017 0209   LABSPEC 1.030 05/27/2017 0209   PHURINE 5.0 05/27/2017 0209   GLUCOSEU NEGATIVE 05/27/2017 0209   HGBUR NEGATIVE 05/27/2017 0209   BILIRUBINUR NEGATIVE 05/27/2017 0209   KETONESUR 20 (A) 05/27/2017 0209   PROTEINUR 30 (A) 05/27/2017 0209   NITRITE NEGATIVE 05/27/2017 0209   LEUKOCYTESUR NEGATIVE 05/27/2017 0209   Sepsis Labs Invalid input(s): PROCALCITONIN,  WBC,  LACTICIDVEN Microbiology Recent Results (from the past 240 hour(s))  Blood Culture (routine x 2)     Status: None   Collection Time: 05/27/17  6:21 AM  Result Value Ref Range Status   Specimen Description BLOOD RIGHT ANTECUBITAL  Final   Special Requests   Final    BOTTLES DRAWN AEROBIC AND ANAEROBIC Blood Culture adequate volume   Culture NO GROWTH 5 DAYS  Final   Report Status 06/01/2017 FINAL  Final  Blood Culture (routine x 2)     Status: Abnormal   Collection Time: 05/27/17  6:26 AM  Result Value Ref Range Status   Specimen Description BLOOD RIGHT HAND  Final   Special Requests IN PEDIATRIC BOTTLE Blood Culture adequate volume  Final   Culture  Setup Time   Final    IN PEDIATRIC BOTTLE GRAM POSITIVE COCCI IN CLUSTERS CRITICAL RESULT CALLED TO, READ BACK BY AND VERIFIED WITH: G.ABBOTT,PARMD 0400 05/28/17 M.CAMPBELL CRITICAL RESULT CALLED TO, READ BACK BY AND VERIFIED WITH: PHARMD T STONE [id number]    Culture STAPHYLOCOCCUS AUREUS (A)  Final   Report Status 05/31/2017 FINAL  Final   Organism ID, Bacteria STAPHYLOCOCCUS AUREUS  Final      Susceptibility   Staphylococcus aureus - MIC*    CIPROFLOXACIN <=0.5 SENSITIVE Sensitive     ERYTHROMYCIN RESISTANT Resistant     GENTAMICIN  <=0.5 SENSITIVE Sensitive     OXACILLIN <=0.25 SENSITIVE Sensitive     TETRACYCLINE <=1 SENSITIVE Sensitive     VANCOMYCIN 1 SENSITIVE Sensitive     TRIMETH/SULFA <=10 SENSITIVE Sensitive     CLINDAMYCIN RESISTANT Resistant     RIFAMPIN <=0.5 SENSITIVE Sensitive     Inducible Clindamycin POSITIVE Resistant     * STAPHYLOCOCCUS AUREUS  Blood Culture ID Panel (Reflexed)     Status: Abnormal   Collection Time: 05/27/17  6:26 AM  Result Value Ref Range Status   Enterococcus species NOT DETECTED NOT DETECTED Final   Listeria monocytogenes NOT DETECTED NOT DETECTED Final   Staphylococcus species DETECTED (A) NOT DETECTED Final    Comment: CRITICAL RESULT CALLED TO, READ BACK BY AND VERIFIED WITH: PHARMD T STONE [id number]    Staphylococcus aureus DETECTED (A) NOT DETECTED Final    Comment: Methicillin (oxacillin) susceptible Staphylococcus aureus (MSSA). Preferred therapy is anti staphylococcal beta lactam antibiotic (Cefazolin or Nafcillin), unless clinically contraindicated. CRITICAL RESULT CALLED TO, READ BACK BY AND VERIFIED WITH: PHARMD T STONE [id number] MLM    Methicillin resistance NOT DETECTED NOT DETECTED Final   Streptococcus species NOT DETECTED NOT DETECTED Final   Streptococcus agalactiae NOT DETECTED NOT DETECTED Final   Streptococcus pneumoniae NOT DETECTED NOT DETECTED Final   Streptococcus pyogenes NOT DETECTED NOT  DETECTED Final   Acinetobacter baumannii NOT DETECTED NOT DETECTED Final   Enterobacteriaceae species NOT DETECTED NOT DETECTED Final   Enterobacter cloacae complex NOT DETECTED NOT DETECTED Final   Escherichia coli NOT DETECTED NOT DETECTED Final   Klebsiella oxytoca NOT DETECTED NOT DETECTED Final   Klebsiella pneumoniae NOT DETECTED NOT DETECTED Final   Proteus species NOT DETECTED NOT DETECTED Final   Serratia marcescens NOT DETECTED NOT DETECTED Final   Haemophilus influenzae NOT DETECTED NOT DETECTED Final   Neisseria meningitidis NOT DETECTED  NOT DETECTED Final   Pseudomonas aeruginosa NOT DETECTED NOT DETECTED Final   Candida albicans NOT DETECTED NOT DETECTED Final   Candida glabrata NOT DETECTED NOT DETECTED Final   Candida krusei NOT DETECTED NOT DETECTED Final   Candida parapsilosis NOT DETECTED NOT DETECTED Final   Candida tropicalis NOT DETECTED NOT DETECTED Final  Culture, blood (routine x 2)     Status: None (Preliminary result)   Collection Time: 05/29/17  6:18 AM  Result Value Ref Range Status   Specimen Description BLOOD BLOOD RIGHT HAND  Final   Special Requests   Final    BOTTLES DRAWN AEROBIC AND ANAEROBIC Blood Culture results may not be optimal due to an excessive volume of blood received in culture bottles   Culture NO GROWTH 3 DAYS  Final   Report Status PENDING  Incomplete  Culture, blood (routine x 2)     Status: None (Preliminary result)   Collection Time: 05/29/17  6:26 AM  Result Value Ref Range Status   Specimen Description BLOOD BLOOD LEFT HAND  Final   Special Requests   Final    BOTTLES DRAWN AEROBIC AND ANAEROBIC Blood Culture results may not be optimal due to an excessive volume of blood received in culture bottles   Culture NO GROWTH 3 DAYS  Final   Report Status PENDING  Incomplete  Aerobic Culture (superficial specimen)     Status: None (Preliminary result)   Collection Time: 05/31/17 10:30 AM  Result Value Ref Range Status   Specimen Description WOUND LEFT  Final   Special Requests ELBOW  Final   Gram Stain   Final    RARE WBC PRESENT, PREDOMINANTLY PMN RARE GRAM POSITIVE COCCI IN PAIRS    Culture   Final    FEW STAPHYLOCOCCUS AUREUS SUSCEPTIBILITIES TO FOLLOW    Report Status PENDING  Incomplete     Time coordinating discharge:  25 minutes  SIGNED:  Latrelle Dodrill, MD  [facility] 06/02/2017, 12:39 PM  Pager please text page via  [email or web address] Password [email or web address]

## 2017-06-02 NOTE — Care Management Note (Signed)
Case Management Note  Patient Details  Name: Andris BaumannDavid Mcglinn MRN: [id number] Date of Birth: 02/07/1981  Subjective/Objective:                    Action/Plan:  Patient  Going home on PO Zyvox 600 mg tab BID , 28 tabs. Patient uninsured. Explained Zyvox assistance program to patient . Patient consulted for NCM to call same on his behalf.   Zyvox assistance program 1 [id number], spoke with Thayer Ohmhris C. .  has been approved 30 days free.   Faxed prescription to Patient   Zyvox Assistance Program [phone]. Zyvox will be shipped over night via UPS to patient ( arriving tomorrow). Patient aware and confirmed address and he will be home if discharged today.   If patient needs additional Zyvox after 30 day window he will have to complete application and fax income proof to [employer]. Patient has application and Patient assistance phone, fax and address.  Bedside nurse aware .Paged MD.   Patient voiced understanding to all of the above.    Expected Discharge Date:                  Expected Discharge Plan:  Home/Self Care  In-House Referral:     Discharge planning Services  CM Consult, Medication Assistance, Indigent Health Clinic  Post Acute Care Choice:    Choice offered to:  Patient  DME Arranged:    DME Agency:     HH Arranged:    HH Agency:     Status of Service:  Completed, signed off  If discussed at [employer] Meetings, dates discussed:    Additional Comments:  Kingsley PlanWile, Winter Jocelyn Marie, RN 06/02/2017, 10:50 AM

## 2017-06-02 NOTE — Progress Notes (Signed)
[facility]/Trauma Progress Note      Subjective: CC: anxiety  Pt expressed a lot of anxiety about getting a picc line and worries he will not be able to administer his abx. We discussed indications for picc lines and how it could benefit him, he will discuss this further with medicine. He reported increased pain and swelling in his L arm yesterday that has resolved today. L AC still draining serous-sangeinous fluid. Pt denies: F, chills, diaphoresis, SOB, abd pain, N/V, new numbness/tingling/swelling.   Objective: Vital signs in last 24 hours: Temp:  [98.4 F (36.9 C)-99.1 F (37.3 C)] 98.4 F (36.9 C) (06/27 0431) Pulse Rate:  [60-78] 65 (06/27 0431) Resp:  [18] 18 (06/27 0431) BP: (125-128)/(80-84) 125/80 (06/27 0431) SpO2:  [94 %-98 %] 94 % (06/27 0431) Last BM Date: 06/01/17  Intake/Output from previous day: 06/26 0701 - 06/27 0700 In: 2120 [P.O.:2120] Out: 2925 [Urine:2925] Intake/Output this shift: No intake/output data recorded.  PE: Physical Exam  Constitutional: He is oriented to person, place, and time. He appears well-developed and well-nourished. No distress.  HENT:  Head: Normocephalic.  Cardiovascular: Normal rate, regular rhythm and normal heart sounds.   No murmur heard. Pulses:      Radial pulses are 2+ on the right side, and 2+ on the left side.  Pulmonary/Chest: Effort normal and breath sounds normal. No respiratory distress. He has no wheezes.  Abdominal: Soft. Bowel sounds are normal. He exhibits no distension. There is no tenderness.  Musculoskeletal:       Left elbow: He exhibits swelling. Tenderness found.  Draining serous-sangeinous fluid. Induration unchanged (4cm proximally, 6cm distally, 5 cm diameter), quarter size erythema surrounding previous IV site, arm warm to touch. No fluctuance, bony tenderness.   Neurological: He is alert and oriented to person, place, and time. No sensory deficit.  Skin: Skin is warm and dry.  Nursing  note and vitals reviewed.   Lab Results:  No results for input(s): WBC, HGB, HCT, PLT in the last 72 hours. BMET No results for input(s): NA, K, CL, CO2, GLUCOSE, BUN, CREATININE, CALCIUM in the last 72 hours. PT/INR No results for input(s): LABPROT, INR in the last 72 hours. CMP     Component Value Date/Time   NA 139 05/30/2017 0443   K 4.0 05/30/2017 0443   CL 107 05/30/2017 0443   CO2 24 05/30/2017 0443   GLUCOSE 83 05/30/2017 0443   BUN 7 05/30/2017 0443   CREATININE 0.81 05/30/2017 0443   CALCIUM 8.4 (L) 05/30/2017 0443   PROT 5.7 (L) 05/29/2017 0538   ALBUMIN 2.8 (L) 05/29/2017 0538   AST 30 05/29/2017 0538   ALT 32 05/29/2017 0538   ALKPHOS 72 05/29/2017 0538   BILITOT 0.4 05/29/2017 0538   GFRNONAA >60 05/30/2017 0443   GFRAA >60 05/30/2017 0443   Lipase  No results found for: LIPASE  Studies/Results: No results found.  Anti-infectives: Anti-infectives    Start     Dose/Rate Route Frequency Ordered Stop   05/28/17 1230  ceFAZolin (ANCEF) IVPB 2g/100 mL premix     2 g 200 mL/hr over 30 Minutes Intravenous Every 8 hours 05/28/17 0835     05/27/17 1600  vancomycin (VANCOCIN) 1,500 mg in sodium chloride 0.9 % 500 mL IVPB  Status:  Discontinued     1,500 mg 250 mL/hr over 120 Minutes Intravenous Every 12 hours 05/27/17 0650 05/28/17 0835   05/27/17 1200  piperacillin-tazobactam (ZOSYN) IVPB 3.375 g  Status:  Discontinued  3.375 g 12.5 mL/hr over 240 Minutes Intravenous Every 8 hours 05/27/17 0650 05/28/17 0835   05/27/17 0630  piperacillin-tazobactam (ZOSYN) IVPB 3.375 g     3.375 g 100 mL/hr over 30 Minutes Intravenous  Once 05/27/17 0620 05/27/17 0804   05/27/17 0630  vancomycin (VANCOCIN) IVPB 1000 mg/200 mL premix     1,000 mg 200 mL/hr over 60 Minutes Intravenous  Once 05/27/17 0620 05/27/17 0955     Assessment/Plan L arm thrombophlebitis  - WBC (05/29/17) 6.1, x2 blood cx negative - final results 05/29/17 - CT LUE: Findings consistent with  cellulitis of left arm as described. No definable abscess, myositis, or osteomyelitis - IV cefazolin, continue warm compresses with elevation. Begin Ibuprofen for anti-inflammatory purposes  - No acute surgical needs, or indication for incision and drainage.   DISPO: Home with IV abx per ID, no surgical intervention recommended at this time. Signing off on pt, call as needed.    LOS: 6 days   Vanice SarahLauren Daya Dutt , PA-S [facility] 06/02/2017, 8:26 AM Consults: [phone] Mon-Fri 7:00 am-4:30 pm Sat-Sun 7:00 am-11:30 am

## 2017-06-03 DIAGNOSIS — I809 Phlebitis and thrombophlebitis of unspecified site: Secondary | ICD-10-CM

## 2017-06-03 LAB — CULTURE, BLOOD (ROUTINE X 2)
CULTURE: NO GROWTH
CULTURE: NO GROWTH

## 2017-06-07 ENCOUNTER — Ambulatory Visit (INDEPENDENT_AMBULATORY_CARE_PROVIDER_SITE_OTHER): Payer: Self-pay | Admitting: Physician Assistant

## 2017-06-07 ENCOUNTER — Encounter (INDEPENDENT_AMBULATORY_CARE_PROVIDER_SITE_OTHER): Payer: Self-pay | Admitting: Physician Assistant

## 2017-06-07 VITALS — BP 120/83 | HR 86 | Temp 98.0°F | Ht 71.0 in | Wt 183.2 lb

## 2017-06-07 DIAGNOSIS — S2242XA Multiple fractures of ribs, left side, initial encounter for closed fracture: Secondary | ICD-10-CM

## 2017-06-07 DIAGNOSIS — A4101 Sepsis due to Methicillin susceptible Staphylococcus aureus: Secondary | ICD-10-CM

## 2017-06-07 DIAGNOSIS — L03114 Cellulitis of left upper limb: Secondary | ICD-10-CM

## 2017-06-07 MED ORDER — ACETAMINOPHEN-CODEINE #3 300-30 MG PO TABS: 1.0000 | ORAL_TABLET | ORAL | 0 refills | Status: AC | PRN

## 2017-06-07 NOTE — Patient Instructions (Signed)
Sepsis, Adult Sepsis is a serious bodily reaction to an infection. The infection that causes sepsis may be from a bacteria, a virus, a fungus, or a parasite. Sepsis can result from an infection in any part of the body. Infections that commonly lead to sepsis include skin, lung, and urinary tract infections. Sepsis is a medical emergency that requires immediate treatment at the hospital. In severe cases, it can lead to septic shock. Shock can weaken the heart and cause blood pressure to drop. This can make the central nervous system and the body's organs to stop working. What are the causes? This condition is caused by a severe reaction to a bacterial, viral, fungal, or parasitic infection. The germs that most commonly lead to sepsis include:  Escherichia coli (E. coli).  Staphylococcus aureus (staph).  The most common infections that lead to sepsis include infections of:  The skin.  The lung (pneumonia).  The gut.  The kidneys (urinary tract infection).  What increases the risk? You are more likely to develop this condition if:  You have a weakened disease-fighting (immune) system.  You are 65 or older.  You are male.  You had surgery, or you have been hospitalized.  You have a catheter, breathing tube, or drainage tubes inserted into your body.  You are not getting enough nutrients from food (are malnourished).  You have other long-term (chronic) diseases, including: ? Cancer. ? AIDS. ? Liver disease. ? Lung disease. ? Diabetes.  You have severe burns or injuries.  You inject drugs.  You have heart valve problems.  What are the signs or symptoms? Symptoms of this condition may include:  Fever.  Chills or feeling very cold.  Fast heart rate (tachycardia).  Rapid breathing (hyperventilation).  Shortness of breath.  Confusion or light-headedness.  Changes in skin color. Your skin may look blotchy, pale, or blue.  Cool, clammy skin or sweaty skin.  Skin  rash.  Nausea and vomiting.  Urinating much less than usual.  How is this diagnosed? This condition is diagnosed based on:  Your symptoms.  Your medical history.  A physical exam.  Other tests may also be done to find out the cause of the infection and how severe the sepsis is. These tests may include:  Blood tests.  Urine tests.  Swabs from other areas of the body that may have an infection. These samples may be tested (cultured) to find out what type of bacteria is causing the infection.  Chest X-ray to check for pneumonia. Other imaging tests, such as a CT scan, may also be done.  Lumbar puncture. This is a procedure to remove a small amount of the fluid that surrounds the brain and spinal cord. The fluid is then examined for infection.  How is this treated? This condition is treated in a hospital with antibiotic medicines. You may also receive:  Fluids through an IV tube.  Oxygen and breathing assistance.  Kidney dialysis. This process cleans the blood if the kidneys have failed.  Surgery to remove infected tissue.  Medicines to increase your blood pressure.  Nutrients to correct imbalances in basic body function (metabolism). This may involve receiving important salts and minerals (electrolytes) through an IV and having your blood sugar level adjusted.  Steroid medicines to control your body's reaction to the infection.  Follow these instructions at home: Medicines  Take over-the-counter and prescription medicines only as told by your health care provider.  If you were prescribed an antibiotic or anti-fungal medicine, take it   as told by your health care provider. Do not stop taking the antibiotic or anti-fungal medicine even if you start to feel better. Activity  Rest and gradually return to your normal activities. Ask your health care provider what activities are safe for you.  Try to set small, achievable goals each week, such as dressing yourself,  bathing, or walking up stairs. It may take a while to rebuild your strength.  Try to exercise regularly, if you feel healthy enough to do so. Ask your health care provider what exercises are safe for you. General instructions  Drink enough fluid to keep your urine clear or pale yellow.  Eat a healthy, balanced diet. This includes plenty of fruits and vegetables, whole grains, and lowfat (lean) proteins. Ask your health care provider if you should avoid certain foods.  Keep all follow-up visits as told by your health care provider. This is important. Contact a health care provider if:  You do not feel like you are getting better or regaining strength.  You are having trouble coping with your recovery.  You frequently feel tired.  You feel worse or do not seem to get better after surgery.  You think you may have an infection after surgery. Get help right away if:  You have any symptoms of sepsis.  You have difficulty breathing.  You have a rapid or skipping heartbeat.  You become confused.  You have a high fever.  Your skin becomes blotchy, pale, or blue. These symptoms may represent a serious problem that is an emergency. Do not wait to see if the symptoms will go away. Get medical help right away. Call your local emergency services (911 in the [place] Summary  Sepsis is a medical emergency that requires immediate treatment at the hospital.  This condition is caused by a severe reaction to a bacterial, viral, fungal, or parasitic infection.  This condition is treated in a hospital with antibiotics. Treatment may also include IV fluids, breathing assistance, and kidney dialysis.  If you were prescribed an antibiotic or anti-fungal medicine, take it as told by your health care provider. Do not stop taking the antibiotic or anti-fungal medicine even if you start to feel better. This information is not intended to replace advice given to you by your health care provider. Make  sure you discuss any questions you have with your health care provider. Document Released: 08/22/2003 Document Revised: 10/27/2016 Document Reviewed: 10/27/2016 Elsevier Interactive Patient Education  2017 Elsevier Inc.  

## 2017-06-07 NOTE — Progress Notes (Signed)
Subjective:  Patient ID: Douglas Daniels, male    DOB: 02-04-1981  Age: 36 y.o. MRN: [id number]  CC: rib fracture and sepsis  HPI Douglas Daniels is a 36 y.o. male with a recent hx of left rib fracture and sepsis presents of hospital f/u. Hospital admission from 05/27/17 to 06/02/17 for a MSSA infection that happened after infection from IV catheter. He had previously been treated at ED for rib fractures that occurred during an episode of intoxication. He is currently taking Linezolid 600 BID and is on his 4th day. Left anterior rib fractures 3-7, fourth through sixth fractures are displaced. Feels pain at times with movement of torso. Denies f/c/n/v or worsening symptoms of cellulitis.   Outpatient Medications Prior to Visit  Medication Sig Dispense Refill  . ibuprofen (ADVIL,MOTRIN) 600 MG tablet Take 1 tablet (600 mg total) by mouth every 6 (six) hours as needed. (Patient taking differently: Take 600 mg by mouth every 6 (six) hours as needed for moderate pain. ) 30 tablet 0  . linezolid (ZYVOX) 600 MG tablet Take 1 tablet (600 mg total) by mouth 2 (two) times daily. 28 tablet 0  . methocarbamol (ROBAXIN) 750 MG tablet Take 1 tablet (750 mg total) by mouth every 8 (eight) hours as needed for muscle spasms. 15 tablet 0  . Oxycodone HCl 10 MG TABS Take 1 tablet (10 mg total) by mouth every 4 (four) hours as needed. (Patient not taking: Reported on 06/07/2017) 20 tablet 0   No facility-administered medications prior to visit.      ROS Review of Systems  Constitutional: Negative for chills, fever and malaise/fatigue.  Eyes: Negative for blurred vision.  Respiratory: Negative for shortness of breath.   Cardiovascular: Negative for chest pain and palpitations.  Gastrointestinal: Negative for abdominal pain and nausea.  Genitourinary: Negative for dysuria and hematuria.  Musculoskeletal: Negative for joint pain and myalgias.       Rib fracture  Skin: Negative for rash.       Left arm redness   Neurological: Negative for tingling and headaches.  Psychiatric/Behavioral: Negative for depression. The patient is not nervous/anxious.     Objective:  BP 120/83 (BP Location: Right Arm, Patient Position: Sitting, Cuff Size: Normal)   Pulse 86   Temp 98 F (36.7 C) (Oral)   Ht 5\' 11"  (1.803 m)   Wt 183 lb 3.2 oz (83.1 kg)   SpO2 95%   BMI 25.55 kg/m   BP/Weight 06/07/2017 06/02/2017 05/27/2017  Systolic BP 120 118 -  Diastolic BP 83 83 -  Wt. (Lbs) 183.2 - 185  BMI 25.55 - 25.09      Physical Exam  Constitutional: He is oriented to person, place, and time.  Well developed, well nourished, NAD, polite  HENT:  Head: Normocephalic and atraumatic.  Eyes: No scleral icterus.  Cardiovascular: Normal rate, regular rhythm and normal heart sounds.   Pulmonary/Chest: Effort normal and breath sounds normal.  Musculoskeletal: He exhibits no edema.  Neurological: He is alert and oriented to person, place, and time.  Skin: Skin is warm and dry.  Mild erythema and very mild edema of left arm from the distal biceps and inferiorly. No lesions, induration, suppuration, or bleeding.  Psychiatric: He has a normal mood and affect. His behavior is normal. Thought content normal.  Vitals reviewed.    Assessment & Plan:   1. Sepsis due to methicillin susceptible Staphylococcus aureus (HCC) - CBC with Differential  2. Cellulitis of left arm - Continue Linezolid 600 mg  BID - Appears infection is resolving as compared to his cell phone pictures.  3. Closed fracture of multiple ribs of left side, initial encounter - Begin acetaminophen-codeine (TYLENOL #3) 300-30 MG tablet; Take 1 tablet by mouth every 4 (four) hours as needed for moderate pain.  Dispense: 42 tablet; Refill: 0   Meds ordered this encounter  Medications  . acetaminophen-codeine (TYLENOL #3) 300-30 MG tablet    Sig: Take 1 tablet by mouth every 4 (four) hours as needed for moderate pain.    Dispense:  42 tablet    Refill:   0    Order Specific Question:   Supervising Provider    Answer:   Quentin AngstJEGEDE, OLUGBEMIGA E [id number]    Follow-up: Return if symptoms worsen or fail to improve.   Loletta Specteroger Merrik Gomez PA

## 2017-06-08 LAB — CBC WITH DIFFERENTIAL/PLATELET
BASOS ABS: 0 10*3/uL (ref 0.0–0.2)
Basos: 1 %
EOS (ABSOLUTE): 0.5 10*3/uL — ABNORMAL HIGH (ref 0.0–0.4)
Eos: 6 %
Hematocrit: 42.5 % (ref 37.5–51.0)
Hemoglobin: 13.8 g/dL (ref 13.0–17.7)
IMMATURE GRANS (ABS): 0 10*3/uL (ref 0.0–0.1)
Immature Granulocytes: 0 %
LYMPHS: 26 %
Lymphocytes Absolute: 1.9 10*3/uL (ref 0.7–3.1)
MCH: 27.7 pg (ref 26.6–33.0)
MCHC: 32.5 g/dL (ref 31.5–35.7)
MCV: 85 fL (ref 79–97)
MONOS ABS: 0.5 10*3/uL (ref 0.1–0.9)
Monocytes: 7 %
NEUTROS ABS: 4.3 10*3/uL (ref 1.4–7.0)
Neutrophils: 60 %
Platelets: 408 10*3/uL — ABNORMAL HIGH (ref 150–379)
RBC: 4.98 x10E6/uL (ref 4.14–5.80)
RDW: 13.6 % (ref 12.3–15.4)
WBC: 7.2 10*3/uL (ref 3.4–10.8)

## 2017-06-10 ENCOUNTER — Telehealth (INDEPENDENT_AMBULATORY_CARE_PROVIDER_SITE_OTHER): Payer: Self-pay

## 2017-06-10 ENCOUNTER — Telehealth (INDEPENDENT_AMBULATORY_CARE_PROVIDER_SITE_OTHER): Payer: Self-pay | Admitting: Physician Assistant

## 2017-06-10 NOTE — Telephone Encounter (Signed)
Returned patients call in regards to lab results and informed patient that he has no sign of infection, but to continue taking his antibiotics until they are completed. Maryjean Mornempestt S Roberts, CMA

## 2017-06-10 NOTE — Telephone Encounter (Signed)
Patient called to request lab results °Please follow up °

## 2017-06-14 ENCOUNTER — Telehealth (INDEPENDENT_AMBULATORY_CARE_PROVIDER_SITE_OTHER): Payer: Self-pay | Admitting: Physician Assistant

## 2017-06-14 NOTE — Telephone Encounter (Signed)
Patient called stated pain medication not helping and would like to talk to Sindy Messingoger Gomez PA over the phone, stated he needs another Rx.  Please follow up with patient,  Gave appt for 06/21/2017

## 2017-06-14 NOTE — Telephone Encounter (Signed)
Pt has to be seen by provider to confirm if persistent infection is present. He was doing better on the previously prescribed Linezolid 600 mg. Needs to be seen.

## 2017-06-14 NOTE — Telephone Encounter (Signed)
Patient has appt scheduled for 7/16. Maryjean Mornempestt S Roberts, CMA

## 2017-06-14 NOTE — Telephone Encounter (Signed)
FWD to PCP. Tempestt S Roberts, CMA  

## 2017-06-17 ENCOUNTER — Telehealth: Payer: Self-pay | Admitting: Physician Assistant

## 2017-06-17 NOTE — Telephone Encounter (Signed)
Patient did not answer the phone, I left a voice mail for the patient to call back for their test results at [phone]

## 2017-06-17 NOTE — Telephone Encounter (Signed)
-----   Message from Margaretmary LombardNubia K [place], [place] sent at 06/17/2017  3:44 PM EDT ----- Please inform patient of no infection being noted and to complete antibiotics

## 2017-06-21 ENCOUNTER — Encounter (INDEPENDENT_AMBULATORY_CARE_PROVIDER_SITE_OTHER): Payer: Self-pay | Admitting: Physician Assistant

## 2017-06-21 ENCOUNTER — Ambulatory Visit (INDEPENDENT_AMBULATORY_CARE_PROVIDER_SITE_OTHER): Payer: Self-pay | Admitting: Physician Assistant

## 2017-06-21 VITALS — BP 135/88 | HR 78 | Temp 98.4°F | Wt 186.2 lb

## 2017-06-21 DIAGNOSIS — S2242XD Multiple fractures of ribs, left side, subsequent encounter for fracture with routine healing: Secondary | ICD-10-CM

## 2017-06-21 DIAGNOSIS — A4101 Sepsis due to Methicillin susceptible Staphylococcus aureus: Secondary | ICD-10-CM

## 2017-06-21 DIAGNOSIS — L03114 Cellulitis of left upper limb: Secondary | ICD-10-CM

## 2017-06-21 MED ORDER — IBUPROFEN 600 MG PO TABS: 600.0000 mg | ORAL_TABLET | Freq: Four times a day (QID) | ORAL | 0 refills | Status: DC | PRN

## 2017-06-21 MED ORDER — ACETAMINOPHEN-CODEINE #3 300-30 MG PO TABS: 1.0000 | ORAL_TABLET | Freq: Four times a day (QID) | ORAL | 0 refills | Status: AC | PRN

## 2017-06-21 NOTE — Patient Instructions (Signed)
Acetaminophen; Codeine tablets  What is this medicine? ACETAMINOPHEN; CODEINE (a set a MEE noe fen; KOE deen) is a pain reliever. It is used to treat mild to moderate pain. This medicine may be used for other purposes; ask your health care provider or pharmacist if you have questions. COMMON BRAND NAME(S): Cocet, Cocet Plus, Tylenol with Codeine No.3, Tylenol with Codeine No.4, Vopac What should I tell my health care provider before I take this medicine? They need to know if you have any of these conditions: -brain tumor -Crohn's disease, inflammatory bowel disease, or ulcerative colitis -drug abuse or addiction -head injury -heart or circulation problems -if you often drink alcohol -kidney disease or problems going to the bathroom -liver disease -lung disease, asthma, or breathing problems -an unusual or allergic reaction to acetaminophen, codeine, salicylates, other opioid analgesics, other medicines, foods, dyes, or preservatives -pregnant or trying to get pregnant -breast-feeding How should I use this medicine? Take this medicine by mouth with a full glass of water. Follow the directions on the prescription label. You can take it with or without food. If if upsets your stomach, take the medicine with food. Do not take your medicine more often than directed. A special MedGuide will be given to you by the pharmacist with each prescription and refill. Be sure to read this information carefully each time. Talk to your pediatrician regarding the use of this medicine in children. Special care may be needed. Overdosage: If you think you have taken too much of this medicine contact a poison control center or emergency room at once. NOTE: This medicine is only for you. Do not share this medicine with others. What if I miss a dose? If you miss a dose, take it as soon as you can. If it is almost time for your next dose, take only that dose. Do not take double or extra doses. What may interact  with this medicine? This medicine may interact with the following medications: -alcohol -antihistamines for allergy, cough and cold -antiviral medicines used for HIV or AIDS -atropine -certain antibiotics like erythromycin and clarithromycin -certain medicines for anxiety or sleep -certain medicines for bladder problems like oxybutynin, tolterodine -certain medicines for depression like amitriptyline, fluoxetine, sertraline -certain medicines for fungal infections like ketoconazole and itraconazole -certain medicines for irregular heart beat like amiodarone, propafenone, quinidine -certain medicines for Parkinson's disease like benztropine, trihexyphenidyl -certain medicines for seizures like carbamazepine, phenobarbital, phenytoin, primidone -certain medicines for stomach problems like dicyclomine, hyoscyamine -certain medicines for travel sickness like scopolamine -general anesthetics like halothane, isoflurane, methoxyflurane, propofol -ipratropium -local anesthetics like lidocaine, pramoxine, tetracaine -MAOIs like Carbex, Eldepryl, Marplan, Nardil, and Parnate -medicines that relax muscles for surgery -other medicines with acetaminophen -other narcotic medicines for pain or cough -phenothiazines like chlorpromazine, mesoridazine, prochlorperazine, thioridazine -rifampin This list may not describe all possible interactions. Give your health care provider a list of all the medicines, herbs, non-prescription drugs, or dietary supplements you use. Also tell them if you smoke, drink alcohol, or use illegal drugs. Some items may interact with your medicine. What should I watch for while using this medicine? Tell your doctor or health care professional if your pain does not go away, if it gets worse, or if you have new or a different type of pain. You may develop tolerance to the medication. Tolerance means that you will need a higher dose of the medication for pain relief. Tolerance is  normal and is expected if you take the medicine for a long time. Do   not suddenly stop taking your medicine because you may develop a severe reaction. Your body becomes used to the medicine. This does NOT mean you are addicted. Addiction is a behavior related to getting and using a drug for a non medical reason. If you have pain, you have a medical reason to take pain medicine. Your doctor will tell you how much medicine to take. If your doctor wants you to stop the medicine, the dose will be slowly lowered over time to avoid any side effects. There are different types of narcotic medicines (opiates). If you take more than one type at the same time or if you are taking another medicine that also causes drowsiness, you may have more side effects. Give your health care provider a list of all medicines you use. Your doctor will tell you how much medicine to take. Do not take more medicine than directed. Call emergency for help if you have problems breathing or unusual sleepiness. Do not take other medicines that contain acetaminophen with this medicine. Always read labels carefully. If you have questions, ask your doctor or pharmacist. If you take too much acetaminophen get medical help right away. Too much acetaminophen can be very dangerous and cause liver damage. Even if you do not have symptoms, it is important to get help right away You may get drowsy or dizzy. Do not drive, use machinery, or do anything that needs mental alertness until you know how this medicine affects you. Do not stand or sit up quickly, especially if you are an older patient. This reduces the risk of dizzy or fainting spells. Alcohol may interfere with the effect of this medicine. Avoid alcoholic drinks. The medicine will cause constipation. Try to have a bowel movement at least every 2 to 3 days. If you do not have a bowel movement for 3 days, call your doctor or health care professional. Your mouth may get dry. Chewing sugarless gum  or sucking hard candy, and drinking plenty of water may help. Contact your doctor if the problem does not go away or is severe. Immediately call your physician or get emergency help if you are breast-feeding and your baby is sleepier than usual, is limp, or has difficulty breastfeeding or breathing. Children may be at higher risk for side effects. If your child has slow breathing, noisy breathing, confusion, or unusual sleepiness, stop giving this medicine and get medical help right away. What side effects may I notice from receiving this medicine? Side effects that you should report to your doctor or health care professional as soon as possible: -allergic reactions like skin rash, itching or hives, swelling of the face, lips, or tongue -breathing problems -confusion -redness, blistering, peeling or loosening of the skin, including inside the mouth -signs and symptoms of low blood pressure like dizziness; feeling faint or lightheaded, falls; unusually weak or tired -trouble passing urine or change in the amount of urine -yellowing of the eyes or skin Side effects that usually do not require medical attention (report to your doctor or health care professional if they continue or are bothersome): -constipation -dry mouth -nausea, vomiting -tiredness This list may not describe all possible side effects. Call your doctor for medical advice about side effects. You may report side effects to FDA at 1-800-FDA-1088. Where should I keep my medicine? Keep out of the reach of children. This medicine can be abused. Keep your medicine in a safe place to protect it from theft. Do not share this medicine with anyone. Selling or   giving away this medicine is dangerous and against the law. This medicine may cause accidental overdose and death if it taken by other adults, children, or pets. Mix any unused medicine with a substance like cat litter or coffee grounds. Then throw the medicine away in a sealed container  like a sealed bag or a coffee can with a lid. Do not use the medicine after the expiration date. Store at room temperature between 15 and 30 degrees C (59 and 86 degrees F). NOTE: This sheet is a summary. It may not cover all possible information. If you have questions about this medicine, talk to your doctor, pharmacist, or health care provider.  2018 Elsevier/Gold Standard (2015-08-16 13:44:28)  

## 2017-06-21 NOTE — Progress Notes (Signed)
Subjective:  Patient ID: Douglas BaumannDavid Daniels, male    DOB: Jun 12, 1981  Age: 36 y.o. MRN: [id number]  CC: refill  HPI Douglas Daniels is a 36 y.o. male with a recent hx of multiple rib fracture, left arm cellulitis, and sepsis presents on f/u for the same. Multiple rib fracture occurred on 05/15/17. Says Tylenol #3 helped significantly with his pain and prefers Tylenol #3 to his original Percocet due to "the way Percocet made me feel". Says he was at approximately 90% better in regards to rib pain but inadvertently lifted a box at work that was "heavier than it looked". He is currently rating himself at 75% better. Says his supervisor is extremely considerate of his work restrictions. Pain is felt around ribs 6-8 when pain is aggravated. Pain radiates along the rib line to the back. Labs drawn at the last visit revealed essentially normal CBC except for plts 408. Denies CP, palpitations, SOB, HA, diaphoresis, swelling, abdominal pain, rash, or GI/GU sxs.     Outpatient Medications Prior to Visit  Medication Sig Dispense Refill  . ibuprofen (ADVIL,MOTRIN) 600 MG tablet Take 1 tablet (600 mg total) by mouth every 6 (six) hours as needed. (Patient taking differently: Take 600 mg by mouth every 6 (six) hours as needed for moderate pain. ) 30 tablet 0   No facility-administered medications prior to visit.      ROS Review of Systems  Constitutional: Negative for chills, fever and malaise/fatigue.  Eyes: Negative for blurred vision.  Respiratory: Negative for shortness of breath.   Cardiovascular: Negative for chest pain and palpitations.  Gastrointestinal: Negative for abdominal pain and nausea.  Genitourinary: Negative for dysuria and hematuria.  Musculoskeletal: Negative for joint pain and myalgias.       Rib pain  Skin: Negative for rash.  Neurological: Negative for tingling and headaches.  Psychiatric/Behavioral: Negative for depression. The patient is not nervous/anxious.     Objective:  BP  135/88 (BP Location: Left Arm, Patient Position: Sitting, Cuff Size: Normal)   Pulse 78   Temp 98.4 F (36.9 C) (Oral)   Wt 186 lb 3.2 oz (84.5 kg)   SpO2 98%   BMI 25.97 kg/m   BP/Weight 06/21/2017 06/07/2017 06/02/2017  Systolic BP 135 120 118  Diastolic BP 88 83 83  Wt. (Lbs) 186.2 183.2 -  BMI 25.97 25.55 -      Physical Exam  Constitutional: He is oriented to person, place, and time.  Well developed, well nourished, NAD, polite  HENT:  Head: Normocephalic and atraumatic.  Eyes: No scleral icterus.  Cardiovascular: Normal rate, regular rhythm and normal heart sounds.   Pulmonary/Chest: Effort normal and breath sounds normal.  Musculoskeletal: He exhibits no edema.  Left ribs without flail chest, no tenderness to palpation.  Neurological: He is alert and oriented to person, place, and time. No cranial nerve deficit. Coordination normal.  Skin: Skin is warm and dry. No rash noted. No erythema. No pallor.  Psychiatric: He has a normal mood and affect. His behavior is normal. Thought content normal.  Vitals reviewed.    Assessment & Plan:   1. Closed fracture of multiple ribs of left side with routine healing, subsequent encounter - Decrease acetaminophen-codeine (TYLENOL #3) 300-30 MG tablet; Take 1 tablet by mouth every 6 (six) hours as needed for moderate pain.  Dispense: 80 tablet; Refill: 0 - Refill ibuprofen (ADVIL,MOTRIN) 600 MG tablet; Take 1 tablet (600 mg total) by mouth every 6 (six) hours as needed.  Dispense: 90 tablet; Refill:  0  2. Cellulitis of left arm - Resolved  3. Sepsis due to methicillin susceptible Staphylococcus aureus (HCC) - Resolved    Meds ordered this encounter  Medications  . acetaminophen-codeine (TYLENOL #3) 300-30 MG tablet    Sig: Take 1 tablet by mouth every 6 (six) hours as needed for moderate pain.    Dispense:  80 tablet    Refill:  0    Order Specific Question:   Supervising Provider    Answer:   Quentin Angst [id number]   . ibuprofen (ADVIL,MOTRIN) 600 MG tablet    Sig: Take 1 tablet (600 mg total) by mouth every 6 (six) hours as needed.    Dispense:  90 tablet    Refill:  0    Order Specific Question:   Supervising Provider    Answer:   Quentin Angst [[id number]]    Follow-up: 4 weeks for full physical.   Loletta Specter PA

## 2017-07-20 ENCOUNTER — Ambulatory Visit (INDEPENDENT_AMBULATORY_CARE_PROVIDER_SITE_OTHER): Payer: Self-pay | Admitting: Physician Assistant

## 2017-07-20 ENCOUNTER — Encounter (INDEPENDENT_AMBULATORY_CARE_PROVIDER_SITE_OTHER): Payer: Self-pay | Admitting: Physician Assistant

## 2017-07-20 VITALS — BP 130/83 | HR 57 | Temp 97.8°F | Ht 71.0 in | Wt 191.0 lb

## 2017-07-20 DIAGNOSIS — Z0000 Encounter for general adult medical examination without abnormal findings: Secondary | ICD-10-CM

## 2017-07-20 DIAGNOSIS — R3911 Hesitancy of micturition: Secondary | ICD-10-CM

## 2017-07-20 LAB — POCT URINALYSIS DIPSTICK
Bilirubin, UA: NEGATIVE
Blood, UA: NEGATIVE
GLUCOSE UA: NEGATIVE
KETONES UA: NEGATIVE
Leukocytes, UA: NEGATIVE
Nitrite, UA: NEGATIVE
Protein, UA: NEGATIVE
SPEC GRAV UA: 1.02 (ref 1.010–1.025)
Urobilinogen, UA: 0.2 E.U./dL
pH, UA: 7 (ref 5.0–8.0)
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
## 2017-07-21 LAB — CBC WITH DIFFERENTIAL/PLATELET
BASOS ABS: 0 10*3/uL (ref 0.0–0.2)
Basos: 1 %
EOS (ABSOLUTE): 0.3 10*3/uL (ref 0.0–0.4)
Eos: 6 %
HEMOGLOBIN: 13.4 g/dL (ref 13.0–17.7)
Hematocrit: 41.6 % (ref 37.5–51.0)
Immature Grans (Abs): 0 10*3/uL (ref 0.0–0.1)
Immature Granulocytes: 0 %
LYMPHS ABS: 1.8 10*3/uL (ref 0.7–3.1)
Lymphs: 32 %
MCH: 27.8 pg (ref 26.6–33.0)
MCHC: 32.2 g/dL (ref 31.5–35.7)
MCV: 86 fL (ref 79–97)
MONOCYTES: 8 %
Monocytes Absolute: 0.4 10*3/uL (ref 0.1–0.9)
Neutrophils Absolute: 2.9 10*3/uL (ref 1.4–7.0)
Neutrophils: 53 %
PLATELETS: 244 10*3/uL (ref 150–379)
RBC: 4.82 x10E6/uL (ref 4.14–5.80)
RDW: 15.9 % — AB (ref 12.3–15.4)
WBC: 5.5 10*3/uL (ref 3.4–10.8)

## 2017-07-21 LAB — COMPREHENSIVE METABOLIC PANEL
A/G RATIO: 1.8 (ref 1.2–2.2)
ALT: 11 IU/L (ref 0–44)
AST: 18 IU/L (ref 0–40)
Albumin: 4.6 g/dL (ref 3.5–5.5)
Alkaline Phosphatase: 79 IU/L (ref 39–117)
BUN / CREAT RATIO: 11 (ref 9–20)
BUN: 9 mg/dL (ref 6–20)
Bilirubin Total: 0.5 mg/dL (ref 0.0–1.2)
CALCIUM: 9 mg/dL (ref 8.7–10.2)
CO2: 20 mmol/L (ref 20–29)
Chloride: 109 mmol/L — ABNORMAL HIGH (ref 96–106)
Creatinine, Ser: 0.81 mg/dL (ref 0.76–1.27)
GFR, EST AFRICAN AMERICAN: 130 mL/min/{1.73_m2} (ref >59)
GFR, EST NON AFRICAN AMERICAN: 113 mL/min/{1.73_m2} (ref >59)
GLOBULIN, TOTAL: 2.5 g/dL (ref 1.5–4.5)
GLUCOSE: 89 mg/dL (ref 65–99)
POTASSIUM: 4 mmol/L (ref 3.5–5.2)
SODIUM: 143 mmol/L (ref 134–144)
Total Protein: 7.1 g/dL (ref 6.0–8.5)

## 2017-07-21 LAB — LIPID PANEL
CHOL/HDL RATIO: 2.7 ratio (ref 0.0–5.0)
Cholesterol, Total: 153 mg/dL (ref 100–199)
HDL: 57 mg/dL (ref >39)
LDL Calculated: 77 mg/dL (ref 0–99)
TRIGLYCERIDES: 96 mg/dL (ref 0–149)
VLDL Cholesterol Cal: 19 mg/dL (ref 5–40)

## 2017-07-21 LAB — PSA: PROSTATE SPECIFIC AG, SERUM: 0.8 ng/mL (ref 0.0–4.0)

## 2017-08-24 ENCOUNTER — Emergency Department (HOSPITAL_COMMUNITY): Payer: No Typology Code available for payment source

## 2017-08-24 ENCOUNTER — Emergency Department (HOSPITAL_COMMUNITY)
Admission: EM | Admit: 2017-08-24 | Discharge: 2017-08-24 | Disposition: A | Discharge status: Home / Self Care | Payer: No Typology Code available for payment source | Attending: Emergency Medicine | Admitting: Emergency Medicine

## 2017-08-24 ENCOUNTER — Encounter (HOSPITAL_COMMUNITY): Payer: Self-pay

## 2017-08-24 DIAGNOSIS — S129XXA Fracture of neck, unspecified, initial encounter: Secondary | ICD-10-CM

## 2017-08-24 DIAGNOSIS — Z8614 Personal history of Methicillin resistant Staphylococcus aureus infection: Secondary | ICD-10-CM | POA: Diagnosis not present

## 2017-08-24 DIAGNOSIS — S12691A Other nondisplaced fracture of seventh cervical vertebra, initial encounter for closed fracture: Secondary | ICD-10-CM | POA: Diagnosis not present

## 2017-08-24 DIAGNOSIS — Y939 Activity, unspecified: Secondary | ICD-10-CM | POA: Diagnosis not present

## 2017-08-24 DIAGNOSIS — Y999 Unspecified external cause status: Secondary | ICD-10-CM | POA: Diagnosis not present

## 2017-08-24 DIAGNOSIS — Y929 Unspecified place or not applicable: Secondary | ICD-10-CM | POA: Insufficient documentation

## 2017-08-24 DIAGNOSIS — F17210 Nicotine dependence, cigarettes, uncomplicated: Secondary | ICD-10-CM | POA: Insufficient documentation

## 2017-08-24 DIAGNOSIS — M542 Cervicalgia: Secondary | ICD-10-CM | POA: Diagnosis present

## 2017-08-24 DIAGNOSIS — S20219A Contusion of unspecified front wall of thorax, initial encounter: Secondary | ICD-10-CM

## 2017-08-24 HISTORY — DX: Unspecified staphylococcus as the cause of diseases classified elsewhere: B95.8

## 2017-08-24 LAB — I-STAT CHEM 8, ED
BUN: 9 mg/dL (ref 6–20)
CREATININE: 0.9 mg/dL (ref 0.61–1.24)
Calcium, Ion: 1.15 mmol/L (ref 1.15–1.40)
Chloride: 103 mmol/L (ref 101–111)
Glucose, Bld: 94 mg/dL (ref 65–99)
HEMATOCRIT: 43 % (ref 39.0–52.0)
HEMOGLOBIN: 14.6 g/dL (ref 13.0–17.0)
POTASSIUM: 3.9 mmol/L (ref 3.5–5.1)
SODIUM: 142 mmol/L (ref 135–145)
TCO2: 28 mmol/L (ref 22–32)

## 2017-08-24 MED ORDER — OXYCODONE-ACETAMINOPHEN 5-325 MG PO TABS
1.0000 | ORAL_TABLET | Freq: Once | ORAL | Status: AC
  Administered 2017-08-24: 1 via ORAL
  Filled 2017-08-24: qty 1

## 2017-08-24 MED ORDER — NAPROXEN 500 MG PO TABS
500.0000 mg | ORAL_TABLET | Freq: Once | ORAL | Status: AC
  Administered 2017-08-24: 500 mg via ORAL
  Filled 2017-08-24: qty 1

## 2017-08-24 MED ORDER — HYDROCODONE-ACETAMINOPHEN 5-325 MG PO TABS: 1.0000 | ORAL_TABLET | Freq: Four times a day (QID) | ORAL | 0 refills | Status: DC | PRN

## 2017-08-24 MED ORDER — METHOCARBAMOL 500 MG PO TABS: 500.0000 mg | ORAL_TABLET | Freq: Two times a day (BID) | ORAL | 0 refills | Status: DC

## 2017-08-24 MED ORDER — METHOCARBAMOL 500 MG PO TABS
500.0000 mg | ORAL_TABLET | Freq: Once | ORAL | Status: AC
  Administered 2017-08-24: 500 mg via ORAL
  Filled 2017-08-24: qty 1
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
## 2017-09-23 ENCOUNTER — Encounter (HOSPITAL_COMMUNITY): Payer: Self-pay | Admitting: Emergency Medicine

## 2017-09-23 ENCOUNTER — Emergency Department (HOSPITAL_COMMUNITY): Payer: Self-pay

## 2017-09-23 DIAGNOSIS — F322 Major depressive disorder, single episode, severe without psychotic features: Secondary | ICD-10-CM | POA: Insufficient documentation

## 2017-09-23 DIAGNOSIS — R45851 Suicidal ideations: Secondary | ICD-10-CM | POA: Insufficient documentation

## 2017-09-23 DIAGNOSIS — F17220 Nicotine dependence, chewing tobacco, uncomplicated: Secondary | ICD-10-CM | POA: Insufficient documentation

## 2017-09-23 DIAGNOSIS — R079 Chest pain, unspecified: Secondary | ICD-10-CM | POA: Insufficient documentation

## 2017-09-23 DIAGNOSIS — Z046 Encounter for general psychiatric examination, requested by authority: Secondary | ICD-10-CM | POA: Insufficient documentation

## 2017-09-23 LAB — POCT I-STAT TROPONIN I: TROPONIN I, POC: 0 ng/mL (ref 0.00–0.08)

## 2017-09-23 LAB — BASIC METABOLIC PANEL
Anion gap: 12 (ref 5–15)
BUN: 12 mg/dL (ref 6–20)
CALCIUM: 9.1 mg/dL (ref 8.9–10.3)
CO2: 20 mmol/L — ABNORMAL LOW (ref 22–32)
CREATININE: 0.91 mg/dL (ref 0.61–1.24)
Chloride: 109 mmol/L (ref 101–111)
GFR calc Af Amer: >60 mL/min (ref >60)
GLUCOSE: 103 mg/dL — AB (ref 65–99)
Potassium: 3.7 mmol/L (ref 3.5–5.1)
SODIUM: 141 mmol/L (ref 135–145)

## 2017-09-23 LAB — CBC
HCT: 45.3 % (ref 39.0–52.0)
Hemoglobin: 15.9 g/dL (ref 13.0–17.0)
MCH: 29.2 pg (ref 26.0–34.0)
MCHC: 35.1 g/dL (ref 30.0–36.0)
MCV: 83.1 fL (ref 78.0–100.0)
PLATELETS: 245 10*3/uL (ref 150–400)
RBC: 5.45 MIL/uL (ref 4.22–5.81)
RDW: 12.9 % (ref 11.5–15.5)
WBC: 8.9 10*3/uL (ref 4.0–10.5)

## 2017-09-23 NOTE — ED Notes (Signed)
Pt states he is not quite feeling right thinks maybe he is suffering from depression  Pt has no hx of that  Pt is tearful in triage  Pt is asking if we have someone here he could talk to   Pt denies SI or HI at this time

## 2017-09-23 NOTE — ED Triage Notes (Signed)
Pt is c/o left chest pain that started about an hour ago while he was sitting around drinking and after he had smoked some pot  Pt states the pain is dull aching with periods of sharp pains  Pt states the pain comes and goes

## 2017-09-24 ENCOUNTER — Inpatient Hospital Stay (HOSPITAL_COMMUNITY)
Admission: AD | Admit: 2017-09-24 | Discharge: 2017-09-28 | DRG: 897 | Disposition: A | Discharge status: Home / Self Care | Payer: Federal, State, Local not specified - Other | Source: Intra-hospital | Attending: Psychiatry | Admitting: Psychiatry

## 2017-09-24 ENCOUNTER — Encounter (HOSPITAL_COMMUNITY): Payer: Self-pay | Admitting: *Deleted

## 2017-09-24 ENCOUNTER — Emergency Department (HOSPITAL_COMMUNITY)
Admission: EM | Admit: 2017-09-24 | Discharge: 2017-09-24 | Disposition: A | Discharge status: Other Acute Inpatient Hospital | Payer: Self-pay | Attending: Emergency Medicine | Admitting: Emergency Medicine

## 2017-09-24 DIAGNOSIS — F17210 Nicotine dependence, cigarettes, uncomplicated: Secondary | ICD-10-CM | POA: Diagnosis present

## 2017-09-24 DIAGNOSIS — F1024 Alcohol dependence with alcohol-induced mood disorder: Secondary | ICD-10-CM | POA: Diagnosis present

## 2017-09-24 DIAGNOSIS — Z818 Family history of other mental and behavioral disorders: Secondary | ICD-10-CM

## 2017-09-24 DIAGNOSIS — Z23 Encounter for immunization: Secondary | ICD-10-CM

## 2017-09-24 DIAGNOSIS — F322 Major depressive disorder, single episode, severe without psychotic features: Secondary | ICD-10-CM | POA: Diagnosis present

## 2017-09-24 DIAGNOSIS — Y902 Blood alcohol level of 40-59 mg/100 ml: Secondary | ICD-10-CM | POA: Diagnosis present

## 2017-09-24 DIAGNOSIS — F332 Major depressive disorder, recurrent severe without psychotic features: Secondary | ICD-10-CM | POA: Diagnosis present

## 2017-09-24 DIAGNOSIS — R45851 Suicidal ideations: Secondary | ICD-10-CM

## 2017-09-24 DIAGNOSIS — F17220 Nicotine dependence, chewing tobacco, uncomplicated: Secondary | ICD-10-CM | POA: Diagnosis not present

## 2017-09-24 DIAGNOSIS — G4700 Insomnia, unspecified: Secondary | ICD-10-CM | POA: Diagnosis present

## 2017-09-24 DIAGNOSIS — F419 Anxiety disorder, unspecified: Secondary | ICD-10-CM | POA: Diagnosis present

## 2017-09-24 DIAGNOSIS — F1020 Alcohol dependence, uncomplicated: Secondary | ICD-10-CM | POA: Diagnosis present

## 2017-09-24 DIAGNOSIS — R450 Nervousness: Secondary | ICD-10-CM | POA: Diagnosis not present

## 2017-09-24 DIAGNOSIS — F1994 Other psychoactive substance use, unspecified with psychoactive substance-induced mood disorder: Secondary | ICD-10-CM | POA: Diagnosis not present

## 2017-09-24 DIAGNOSIS — R079 Chest pain, unspecified: Secondary | ICD-10-CM

## 2017-09-24 DIAGNOSIS — Z87891 Personal history of nicotine dependence: Secondary | ICD-10-CM | POA: Diagnosis not present

## 2017-09-24 LAB — RAPID URINE DRUG SCREEN, HOSP PERFORMED
AMPHETAMINES: NOT DETECTED
BARBITURATES: NOT DETECTED
BENZODIAZEPINES: NOT DETECTED
COCAINE: NOT DETECTED
Opiates: NOT DETECTED
Tetrahydrocannabinol: POSITIVE — AB

## 2017-09-24 LAB — ETHANOL: Alcohol, Ethyl (B): 48 mg/dL — ABNORMAL HIGH (ref <10)

## 2017-09-24 MED ORDER — FLUOXETINE HCL 20 MG PO CAPS
20.0000 mg | ORAL_CAPSULE | Freq: Every day | ORAL | Status: DC
  Administered 2017-09-25 – 2017-09-27 (×3): 20 mg via ORAL
  Filled 2017-09-24 (×6): qty 1

## 2017-09-24 MED ORDER — LORAZEPAM 1 MG PO TABS
0.0000 mg | ORAL_TABLET | Freq: Four times a day (QID) | ORAL | Status: DC
Start: 2017-09-24 — End: 2017-09-24
  Administered 2017-09-24: 1 mg via ORAL
  Filled 2017-09-24: qty 1

## 2017-09-24 MED ORDER — LORAZEPAM 2 MG/ML IJ SOLN: 0.0000 mg | Freq: Four times a day (QID) | INTRAMUSCULAR | Status: DC

## 2017-09-24 MED ORDER — LORAZEPAM 1 MG PO TABS
ORAL_TABLET | ORAL | Status: AC
  Filled 2017-09-24: qty 1

## 2017-09-24 MED ORDER — ONDANSETRON 4 MG PO TBDP: 4.0000 mg | ORAL_TABLET | Freq: Four times a day (QID) | ORAL | Status: AC | PRN

## 2017-09-24 MED ORDER — LOPERAMIDE HCL 2 MG PO CAPS: 2.0000 mg | ORAL_CAPSULE | ORAL | Status: AC | PRN

## 2017-09-24 MED ORDER — ONDANSETRON HCL 4 MG PO TABS: 4.0000 mg | ORAL_TABLET | Freq: Three times a day (TID) | ORAL | Status: DC | PRN

## 2017-09-24 MED ORDER — IBUPROFEN 200 MG PO TABS
600.0000 mg | ORAL_TABLET | Freq: Once | ORAL | Status: AC
  Administered 2017-09-24: 600 mg via ORAL
  Filled 2017-09-24: qty 3

## 2017-09-24 MED ORDER — NICOTINE 21 MG/24HR TD PT24
MEDICATED_PATCH | TRANSDERMAL | Status: AC
Start: 2017-09-24 — End: 2017-09-24
  Administered 2017-09-24: 15:00:00
  Filled 2017-09-24: qty 1

## 2017-09-24 MED ORDER — MAGNESIUM HYDROXIDE 400 MG/5ML PO SUSP: 30.0000 mL | Freq: Every day | ORAL | Status: DC | PRN

## 2017-09-24 MED ORDER — ADULT MULTIVITAMIN W/MINERALS CH
1.0000 | ORAL_TABLET | Freq: Every day | ORAL | Status: DC
  Administered 2017-09-24 – 2017-09-28 (×5): 1 via ORAL
  Filled 2017-09-24 (×8): qty 1

## 2017-09-24 MED ORDER — INFLUENZA VAC SPLIT QUAD 0.5 ML IM SUSY
0.5000 mL | PREFILLED_SYRINGE | INTRAMUSCULAR | Status: DC
  Filled 2017-09-24: qty 0.5

## 2017-09-24 MED ORDER — LORAZEPAM 1 MG PO TABS
1.0000 mg | ORAL_TABLET | Freq: Four times a day (QID) | ORAL | Status: AC
Start: 2017-09-24 — End: 2017-09-24
  Administered 2017-09-24 (×2): 1 mg via ORAL
  Filled 2017-09-24: qty 1

## 2017-09-24 MED ORDER — TRAZODONE HCL 50 MG PO TABS
50.0000 mg | ORAL_TABLET | Freq: Every evening | ORAL | Status: DC | PRN
  Administered 2017-09-24 – 2017-09-26 (×6): 50 mg via ORAL
  Filled 2017-09-24 (×12): qty 1

## 2017-09-24 MED ORDER — LORAZEPAM 1 MG PO TABS: 0.0000 mg | ORAL_TABLET | Freq: Four times a day (QID) | ORAL | Status: DC

## 2017-09-24 MED ORDER — ALUM & MAG HYDROXIDE-SIMETH 200-200-20 MG/5ML PO SUSP
30.0000 mL | Freq: Four times a day (QID) | ORAL | Status: DC | PRN
  Administered 2017-09-24 – 2017-09-26 (×3): 30 mL via ORAL
  Filled 2017-09-24 (×3): qty 30

## 2017-09-24 MED ORDER — LORAZEPAM 1 MG PO TABS
1.0000 mg | ORAL_TABLET | Freq: Three times a day (TID) | ORAL | Status: AC
  Administered 2017-09-25 (×3): 1 mg via ORAL
  Filled 2017-09-24 (×3): qty 1

## 2017-09-24 MED ORDER — HYDROXYZINE HCL 25 MG PO TABS
25.0000 mg | ORAL_TABLET | Freq: Four times a day (QID) | ORAL | Status: DC | PRN
  Administered 2017-09-24 – 2017-09-27 (×7): 25 mg via ORAL
  Filled 2017-09-24 (×8): qty 1

## 2017-09-24 MED ORDER — LORAZEPAM 1 MG PO TABS
1.0000 mg | ORAL_TABLET | Freq: Two times a day (BID) | ORAL | Status: AC
  Administered 2017-09-26 (×2): 1 mg via ORAL
  Filled 2017-09-24 (×2): qty 1

## 2017-09-24 MED ORDER — LORAZEPAM 1 MG PO TABS: 1.0000 mg | ORAL_TABLET | Freq: Four times a day (QID) | ORAL | Status: DC | PRN

## 2017-09-24 MED ORDER — ACETAMINOPHEN 325 MG PO TABS
650.0000 mg | ORAL_TABLET | Freq: Four times a day (QID) | ORAL | Status: DC | PRN
  Administered 2017-09-26 – 2017-09-27 (×2): 650 mg via ORAL
  Filled 2017-09-24 (×2): qty 2

## 2017-09-24 MED ORDER — LORAZEPAM 1 MG PO TABS
1.0000 mg | ORAL_TABLET | Freq: Every day | ORAL | Status: AC
  Administered 2017-09-27: 1 mg via ORAL
  Filled 2017-09-24: qty 1

## 2017-09-24 MED ORDER — ALUM & MAG HYDROXIDE-SIMETH 200-200-20 MG/5ML PO SUSP: 30.0000 mL | Freq: Four times a day (QID) | ORAL | Status: DC | PRN

## 2017-09-24 MED ORDER — VITAMIN B-1 100 MG PO TABS
100.0000 mg | ORAL_TABLET | Freq: Every day | ORAL | Status: DC
  Administered 2017-09-25 – 2017-09-28 (×4): 100 mg via ORAL
  Filled 2017-09-24 (×7): qty 1

## 2017-09-24 MED ORDER — FLUOXETINE HCL 20 MG PO CAPS
20.0000 mg | ORAL_CAPSULE | Freq: Every day | ORAL | Status: DC
  Administered 2017-09-24: 20 mg via ORAL
  Filled 2017-09-24: qty 1
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
## 2017-09-24 NOTE — Tx Team (Signed)
Initial Treatment Plan 09/24/2017 3:57 PM Douglas Baumannavid Daniels ZOX:[id number]    PATIENT STRESSORS: Financial difficulties Health problems Legal issue   PATIENT STRENGTHS: Ability for insight Active sense of humor Average or above average intelligence Capable of independent living   PATIENT IDENTIFIED PROBLEMS: Depression  Suicidal Ideation  Polysubstane Abuse          " I don't want to die"  " I love my job"     DISCHARGE CRITERIA:  Ability to meet basic life and health needs Adequate post-discharge living arrangements Improved stabilization in mood, thinking, and/or behavior Medical problems require only outpatient monitoring  PRELIMINARY DISCHARGE PLAN: Attend aftercare/continuing care group Attend PHP/IOP Attend 12-step recovery group  PATIENT/FAMILY INVOLVEMENT: This treatment plan has been presented to and reviewed with the patient, Douglas Daniels, and/or family member, .  The patient and family have been given the opportunity to ask questions and make suggestions.  Rich Braveuke, Lemon Whitacre Lynn, RN 09/24/2017, 3:57 PM

## 2017-09-24 NOTE — ED Notes (Signed)
[facility] Patient ambulatory off unit with [facility]. [facility] Patient signed e-signature.

## 2017-09-24 NOTE — ED Notes (Signed)
Report given to [facility] RN 
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
## 2017-09-24 NOTE — ED Notes (Signed)
PT aware of urine sample 
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
## 2017-09-24 NOTE — ED Notes (Signed)
Admission Note:  [facility]. Patient stated that he's here because "I don't feel good, something wasn't right in my head and a friend told me to come talk to somebody". Patient stated that he's had a bad summer and is ready to get over what all he's been through these last few months. Patient denies SI/HI/AVH at this time. Special checks and video monitoring in place at this time. Patient safe on the unit, will continue to monitor.

## 2017-09-24 NOTE — BH Assessment (Signed)
[facility] Assessment Progress Note  Per Thedore MinsMojeed Akintayo, MD, this pt requires psychiatric hospitalization at this time.  Berneice Heinrichina Tate, RN, [facility] Rm [facility].  Pt has signed Voluntary Admission and Consent for Treatment, as well as Consent to Release Information to no one, and signed forms have been faxed [facility].  Pt's nurse, Morrie Sheldonshley, has been notified, and agrees to send original paperwork along with pt via [facility], and to call report to [phone].  Doylene Canninghomas Gareth Fitzner, MA Triage Specialist [phone]

## 2017-09-24 NOTE — Plan of Care (Signed)
Problem: Safety: Goal: Periods of time without injury will increase Outcome: Progressing Pt remains a low fall risk, denies SI at this time.    

## 2017-09-25 DIAGNOSIS — F332 Major depressive disorder, recurrent severe without psychotic features: Secondary | ICD-10-CM

## 2017-09-25 DIAGNOSIS — F1020 Alcohol dependence, uncomplicated: Secondary | ICD-10-CM

## 2017-09-25 MED ORDER — NICOTINE 21 MG/24HR TD PT24
MEDICATED_PATCH | TRANSDERMAL | Status: AC
  Administered 2017-09-25: 21 mg
  Filled 2017-09-25: qty 1

## 2017-09-25 MED ORDER — NICOTINE 21 MG/24HR TD PT24
MEDICATED_PATCH | TRANSDERMAL | Status: AC
  Administered 2017-09-25: 18:00:00
  Filled 2017-09-25: qty 1

## 2017-09-25 NOTE — Progress Notes (Signed)
D: Patient observed resting in bed this AM and states he feels "hungover" due to trazadone. "I just feel like I can't shake it."   Patient's affect flat, depressed with congruent mood. Per self inventory and discussions with writer, rates depression at an 8/10, hopelessness at a 7/10. Rates sleep as fair, appetite as good, energy as normal and concentration as poor.  States goal for today is "anxiety." Denies pain, physical problems.   A: Medicated per orders, ativan protocol in place for withdrawal management. Vaccines given. No prn meds required or requested. Level III obs in place for safety. Emotional support offered and self inventory reviewed. Encouraged completion of Suicide Safety Plan and programming participation. Discussed POC with MD, SW.  R: Patient verbalizes understanding of POC. Patient denies SI/HI/AVH and remains safe on level III obs. Will continue to monitor closely and make verbal contact frequently.

## 2017-09-25 NOTE — Progress Notes (Signed)
DATA ACTION RESPONSE  Objective- Pt. is visible in the dayroom, seen watching TV. Presents with a anxious/depressed affect and mood. Pt c/o of insomnia this evening.   Subjective- Denies having any SI/HI/AVH/Pain at this time. Is cooperative and remains safe on the unit.  1:1 interaction in private to establish rapport. Encouragement, education, & support given from staff. PRN repeat of Trazodone given; will re-eval.   Safety maintained with Q 15 checks. Continue with POC.
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
## 2017-09-25 NOTE — Plan of Care (Signed)
Problem: Medication: Goal: Compliance with prescribed medication regimen will improve Outcome: Progressing Pt is taking medications as prescribed.   
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
## 2017-09-25 NOTE — Progress Notes (Signed)
  DATA ACTION RESPONSE  Objective- Pt. is visible in the dayroom, seen talking on the phone.Presents with a flat/depressed affect and mood. Pt was guarded on approach. Provider on call notified to obtain sleep medication per Pt. request.  Subjective- Denies having any SI/HI/AVH/Pain at this time. Is cooperative and remains safe on the unit.  1:1 interaction in private to establish rapport. Encouragement, education, & support given from staff.    Safety maintained with Q 15 checks. Continue with POC.

## 2017-09-26 DIAGNOSIS — G4700 Insomnia, unspecified: Secondary | ICD-10-CM

## 2017-09-26 DIAGNOSIS — F419 Anxiety disorder, unspecified: Secondary | ICD-10-CM

## 2017-09-26 DIAGNOSIS — F322 Major depressive disorder, single episode, severe without psychotic features: Secondary | ICD-10-CM

## 2017-09-26 DIAGNOSIS — F17220 Nicotine dependence, chewing tobacco, uncomplicated: Secondary | ICD-10-CM

## 2017-09-26 DIAGNOSIS — R450 Nervousness: Secondary | ICD-10-CM

## 2017-09-26 MED ORDER — NICOTINE 21 MG/24HR TD PT24
21.0000 mg | MEDICATED_PATCH | Freq: Every day | TRANSDERMAL | Status: DC
  Administered 2017-09-26 – 2017-09-28 (×3): 21 mg via TRANSDERMAL
  Filled 2017-09-26 (×5): qty 1

## 2017-09-26 MED ORDER — IBUPROFEN 600 MG PO TABS
600.0000 mg | ORAL_TABLET | Freq: Four times a day (QID) | ORAL | Status: DC | PRN
  Administered 2017-09-26: 600 mg via ORAL
  Filled 2017-09-26: qty 1
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
## 2017-09-26 NOTE — Progress Notes (Signed)
D Pt. Denies SI and HI.  No complaints of pain or discomfort noted at present time.  A  Writer offered support and encouragement.  Discussed patients day as well as coping skills.  R Pt rates his day a 5 his depression and anxiety both a 7.  Pt. Filled out a 72 hr. Request for discharge then rescinded it stating [facility].  Pt. Then states he feels like he needs to go home.  Very superficial and unsure of what he wants or needs.  Pt. Remains safe on the unit.

## 2017-09-26 NOTE — [facility] Suicide Risk Assessment (Signed)
[facility] INPATIENT:  Family/Significant Other Suicide Prevention Education  Suicide Prevention Education:  Patient Refusal for Family/Significant Other Suicide Prevention Education: The patient Douglas Daniels has refused to provide written consent for family/significant other to be provided Family/Significant Other Suicide Prevention Education during admission and/or prior to discharge.  Physician notified.  Carloyn JaegerMareida J Grossman-Orr 09/26/2017, 5:12 PM

## 2017-09-26 NOTE — Progress Notes (Signed)
[facility]  09/26/2017 10:40 AM Douglas BaumannDavid Daniels  MRN:  [id number] Subjective:  Douglas HuaDavid reports " I need to talk to Psych to get this ball rolling" Objective: Douglas BaumannDavid Daniels is awake, alert and oriented. Seen standing at the nursing station. Patient present with irritability  and depression.  Denies suicidal or homicidal ideation during this assessment. Denies auditory or visual hallucination and does not appear to be responding to internal stimuli.  Patient reports he was just having "bad thought on admission and didn't care to elaborate. States I just came her to talk. mainly to psych.  Support, encouragement and reassurance was provided.   Principal Problem: Major depressive disorder, single episode, severe without psychosis (HCC) Diagnosis:   Patient Active Problem List   Diagnosis Date Noted  . Major depressive disorder, single episode, severe without psychotic features (HCC) [F32.2] 09/24/2017  . Alcohol use disorder, moderate, dependence (HCC) [F10.20] 09/24/2017  . Major depressive disorder, single episode, severe without psychosis (HCC) [F32.2] 09/24/2017  . Phlebitis [I80.9]   . MSSA bacteremia [R78.81] 05/29/2017  . Septic thrombophlebitis of upper extremity [I80.8] 05/29/2017  . Hypokalemia [E87.6] 05/29/2017  . Cellulitis of left arm [L03.114] 05/27/2017  . Sepsis (HCC) [A41.9] 05/27/2017  . Fall [W19.XXXA] 05/18/2017  . Multiple fractures of ribs, left side, initial encounter for closed fracture [S22.42XA] 05/15/2017   Total Time spent with patient: 30 minutes  Past Psychiatric History:   Past Medical History:  Past Medical History:  Diagnosis Date  . Cellulitis 05/2017   left arm  . Fall 05/18/2017  . Rib fractures 05/2017  . Staph infection     Past Surgical History:  Procedure Laterality Date  . NO PAST SURGERIES     Family History: History reviewed. No pertinent family history. Family Psychiatric  History:  Social History:  History  Alcohol Use  . Yes     Comment: moderate use      History  Drug Use  . Types: Marijuana    Social History   Social History  . Marital status: Single    Spouse name: N/A  . Number of children: N/A  . Years of education: N/A   Social History Main Topics  . Smoking status: Former Smoker    Packs/day: 2.00    Years: 5.00    Types: Cigarettes  . Smokeless tobacco: Current User    Types: Chew  . Alcohol use Yes     Comment: moderate use   . Drug use: Yes    Types: Marijuana  . Sexual activity: Not Asked   Other Topics Concern  . None   Social History Narrative  . None   Additional Social History:    History of alcohol / drug use?: Yes Negative Consequences of Use: Legal, Personal relationships, Financial Withdrawal Symptoms: Agitation Name of Substance 1: Tobacco/Cigarettes. 1 - Amount (size/oz): Pt reported, he chews tobacco. Pt reported, smoking three cigarettes daily.  1 - Duration: Ongoing. 1 - Last Use / Amount: Pt reported, daily. Name of Substance 2: Alochol 2 - Amount (size/oz): Pt reported, he drank six beers last night.  2 - Frequency: Pt reported, be has cut back on his drinking.  2 - Last Use / Amount: Pt reported, last night.                 Sleep: Fair  Appetite:  Fair  Current Medications: Current Facility-Administered Medications  Medication Dose Route Frequency Provider Last Rate Last Dose  . acetaminophen (TYLENOL) tablet 650 mg  650 mg Oral Q6H PRN Charm Rings, NP      . alum & mag hydroxide-simeth (MAALOX/MYLANTA) 200-200-20 MG/5ML suspension 30 mL  30 mL Oral Q6H PRN Charm Rings, NP   30 mL at 09/25/17 1726  . FLUoxetine (PROZAC) capsule 20 mg  20 mg Oral Daily Charm Rings, NP   20 mg at 09/26/17 0851  . hydrOXYzine (ATARAX/VISTARIL) tablet 25 mg  25 mg Oral Q6H PRN Cobos, Rockey Situ, MD   25 mg at 09/26/17 0330  . Influenza vac split quadrivalent PF (FLUARIX) injection 0.5 mL  0.5 mL Intramuscular Tomorrow-1000 Cobos, Fernando A, MD      .  loperamide (IMODIUM) capsule 2-4 mg  2-4 mg Oral PRN Cobos, Rockey Situ, MD      . LORazepam (ATIVAN) tablet 1 mg  1 mg Oral Q6H PRN Cobos, Rockey Situ, MD      . LORazepam (ATIVAN) tablet 1 mg  1 mg Oral BID Cobos, Rockey Situ, MD   1 mg at 09/26/17 0851   Followed by  . [START ON 09/27/2017] LORazepam (ATIVAN) tablet 1 mg  1 mg Oral Daily Cobos, Fernando A, MD      . magnesium hydroxide (MILK OF MAGNESIA) suspension 30 mL  30 mL Oral Daily PRN Charm Rings, NP      . multivitamin with minerals tablet 1 tablet  1 tablet Oral Daily Cobos, Rockey Situ, MD   1 tablet at 09/26/17 0851  . nicotine (NICODERM CQ - dosed in mg/24 hours) patch 21 mg  21 mg Transdermal Daily Oneta Rack, NP   21 mg at 09/26/17 0856  . ondansetron (ZOFRAN-ODT) disintegrating tablet 4 mg  4 mg Oral Q6H PRN Cobos, Fernando A, MD      . thiamine (VITAMIN B-1) tablet 100 mg  100 mg Oral Daily Cobos, Rockey Situ, MD   100 mg at 09/26/17 0851  . traZODone (DESYREL) tablet 50 mg  50 mg Oral QHS,MR X 1 Nira Conn A, NP   50 mg at 09/25/17 2205    Lab Results: No results found for this or any previous visit (from the past 48 hour(s)).  Blood Alcohol level:  Lab Results  Component Value Date   ETH 48 (H) 09/24/2017   ETH 292 (H) 05/15/2017    Metabolic Disorder Labs: No results found for: HGBA1C, MPG No results found for: PROLACTIN Lab Results  Component Value Date   CHOL 153 07/20/2017   TRIG 96 07/20/2017   HDL 57 07/20/2017   CHOLHDL 2.7 07/20/2017   LDLCALC 77 07/20/2017    Physical Findings: AIMS: Facial and Oral Movements Muscles of Facial Expression: None, normal Lips and Perioral Area: None, normal Jaw: None, normal Tongue: None, normal,Extremity Movements Upper (arms, wrists, hands, fingers): None, normal Lower (legs, knees, ankles, toes): None, normal, Trunk Movements Neck, shoulders, hips: None, normal, Overall Severity Severity of abnormal movements (highest score from questions above): None,  normal Incapacitation due to abnormal movements: None, normal Patient's awareness of abnormal movements (rate only patient's report): No Awareness, Dental Status Current problems with teeth and/or dentures?: No Does patient usually wear dentures?: No  CIWA:  CIWA-Ar Total: 4 COWS:  COWS Total Score: 1  Musculoskeletal: Strength & Muscle Tone: within normal limits Gait & Station: normal Patient leans: N/A  Psychiatric Specialty Exam: Physical Exam  Nursing note and vitals reviewed. Constitutional: He appears well-developed.  HENT:  Head: Normocephalic.  Neurological: He is alert.  Psychiatric: He has a normal  mood and affect.    Review of Systems  Psychiatric/Behavioral: Positive for depression. The patient is nervous/anxious.     Blood pressure 100/82, pulse (!) 101, temperature 98 F (36.7 C), temperature source Oral, resp. rate 18, height 6' (1.829 m), weight 81.6 kg (180 lb), SpO2 100 %.Body mass index is 24.41 kg/m.  General Appearance: Casual guarded and depression   Eye Contact:  Fair  Speech:  Clear and Coherent  Volume:  Normal  Mood:  Anxious and Depressed  Affect:  Depressed  Thought Process:  Coherent, Linear and Descriptions of Associations: Intact  Orientation:  Full (Time, Place, and Person)  Thought Content:  Logical  Suicidal Thoughts:  No  Homicidal Thoughts:  No  Memory:  Immediate;   Fair Recent;   Fair Remote;   Fair  Judgement:  Intact  Insight:  Fair  Psychomotor Activity:  Normal  Concentration:  Concentration: Fair  Recall:  [employer] of Knowledge:  Fair  Language:  Fair  Akathisia:  No  Handed:  Right  AIMS (if indicated):     Assets:  Communication Skills Leisure Time Physical Health  ADL's:  Intact  Cognition:  WNL  Sleep:        I agree with current treatment plan on 09/26/2017, Patient seen face-to-face for psychiatric evaluation follow-up, chart reviewed. Reviewed the information documented and agree with the treatment  plan.  Treatment Plan Summary: Daily contact with patient to assess and evaluate symptoms and progress in treatment and Medication management   Continue with Prozac 20 mg  for mood stabilization. Continue with Trazodone 50 mg  for insomnia Started on CWIA/ Ativan Protocol Will continue to monitor vitals ,medication compliance and treatment side effects while patient is here.  Reviewed labs:BAL - 48, UDS - positive  THC  CSW will start working on disposition.  Patient to participate in therapeutic milieu  Oneta Rack, NP 09/26/2017, 10:40 AM

## 2017-09-26 NOTE — Progress Notes (Signed)
Patient did attend the evening speaker AA meeting.  

## 2017-09-26 NOTE — Progress Notes (Signed)
Nursing Note 09/26/2017 [id number]  Data Reports sleeping good. Rates depression 7/10, hopelessness 4/10, and anxiety 7/10. Affect wide ranged, bright.  Denies HI, SI, AVH.  Rescinded 72 hour request for discharge "I don't have enough resources yet, I haven't seen everyone I need to see."  "I would like to go tomorrow but I don't think we have everything ready." Spent AM in room resting, but afternoon and evening in milieu interacting.  Action Spoke with patient 1:1, nurse offered support to patient throughout shift.  Continues to be monitored on 15 minute checks for safety.  Response Remains safe and appropriate on unit.

## 2017-09-26 NOTE — [facility] Group Notes (Signed)
[facility]  Date/Time:  09/26/2017 1:15-2:15PM  Type of Therapy and Topic:  Group Therapy:  Healthy and Unhealthy Supports  Participation Level:  Active   Description of Group:  Patients in this group were introduced to the idea of adding a variety of healthy supports to address the various needs in their lives. The picture on the front of Sunday's workbook was used to demonstrate why more supports are needed in every patient's life.  Patients identified and described healthy supports versus unhealthy supports in general, then gave examples of each in their own lives.   They discussed what additional healthy supports could be helpful in their recovery and wellness after discharge in order to prevent future hospitalizations.   An emphasis was placed on using counselor, doctor, therapy groups, 12-step groups, and problem-specific support groups to expand supports.  They also worked as a group on developing a specific plan for several patients to deal with unhealthy supports through boundary-setting, psychoeducation with loved ones, and even termination of relationships.   Therapeutic Goals:   1)  discuss importance of adding supports to stay well once out of the hospital  2)  compare healthy versus unhealthy supports and identify some examples of each  3)  generate ideas and descriptions of healthy supports that can be added  4)  offer mutual support about how to address unhealthy supports  5)  encourage active participation in and adherence to discharge plan    Summary of Patient Progress:  The patient shared that the current unhealthy supports available in his life are himself after 1-2 months of sobriety and [employer], while the current healthy supports are his family and brother-in-law.  The patient expressed a willingness to add medication to help in his recovery journey.   Therapeutic Modalities:   Motivational Interviewing Brief Solution-Focused  Therapy  Ambrose MantleMareida Grossman-Orr, LCSW 09/26/2017, 2:15PM

## 2017-09-27 DIAGNOSIS — F1994 Other psychoactive substance use, unspecified with psychoactive substance-induced mood disorder: Secondary | ICD-10-CM

## 2017-09-27 DIAGNOSIS — Z87891 Personal history of nicotine dependence: Secondary | ICD-10-CM

## 2017-09-27 MED ORDER — TRAZODONE HCL 100 MG PO TABS
100.0000 mg | ORAL_TABLET | Freq: Every day | ORAL | Status: DC
Start: 2017-09-27 — End: 2017-09-28
  Administered 2017-09-27: 100 mg via ORAL
  Filled 2017-09-27: qty 7
  Filled 2017-09-27 (×2): qty 1

## 2017-09-27 MED ORDER — HYDROXYZINE HCL 25 MG PO TABS
25.0000 mg | ORAL_TABLET | Freq: Four times a day (QID) | ORAL | Status: DC | PRN
  Administered 2017-09-27 – 2017-09-28 (×2): 25 mg via ORAL
  Filled 2017-09-27: qty 1
  Filled 2017-09-27: qty 10
  Filled 2017-09-27: qty 1

## 2017-09-27 MED ORDER — ARIPIPRAZOLE 5 MG PO TABS
5.0000 mg | ORAL_TABLET | Freq: Every day | ORAL | Status: DC
  Administered 2017-09-27 – 2017-09-28 (×2): 5 mg via ORAL
  Filled 2017-09-27 (×4): qty 1
  Filled 2017-09-27: qty 7

## 2017-09-27 MED ORDER — FLUOXETINE HCL 10 MG PO CAPS: 10.0000 mg | ORAL_CAPSULE | Freq: Every day | ORAL | Status: DC

## 2017-09-27 NOTE — [facility] Group Notes (Signed)
[facility] Group Therapy 09/27/2017 1:15pm  Type of Therapy: Mental Health Association Presentation  Participation Level: Active  Participation Quality: Attentive  Affect: Appropriate  Cognitive: Oriented  Insight: Developing/Improving  Engagement in Therapy: Engaged  Modes of Intervention: Discussion, Education and Socialization  Summary of Progress/Problems: Mental Health Association (MHA) Speaker came to talk about his personal journey with mental health. The pt processed ways by which to relate to the speaker. MHA speaker provided handouts and educational information pertaining to groups and services offered by [facility]. Pt was engaged in speaker's presentation and was receptive to resources provided.    Pulte HomesHeather N Smart, LCSW 09/27/2017 2:38 PM

## 2017-09-27 NOTE — Tx Team (Signed)
Interdisciplinary Treatment and Diagnostic Plan Update  09/27/2017 Time of Session: [id number] Douglas BaumannDavid Daniels MRN: [id number]  Principal Diagnosis: Major depressive disorder, single episode, severe without psychosis (HCC)  Secondary Diagnoses: Principal Problem:   Major depressive disorder, single episode, severe without psychosis (HCC)   Current Medications:  Current Facility-Administered Medications  Medication Dose Route Frequency Provider Last Rate Last Dose  . acetaminophen (TYLENOL) tablet 650 mg  650 mg Oral Q6H PRN Charm RingsLord, Jamison Y, NP   650 mg at 09/26/17 1214  . alum & mag hydroxide-simeth (MAALOX/MYLANTA) 200-200-20 MG/5ML suspension 30 mL  30 mL Oral Q6H PRN Charm RingsLord, Jamison Y, NP   30 mL at 09/26/17 1455  . FLUoxetine (PROZAC) capsule 20 mg  20 mg Oral Daily Charm RingsLord, Jamison Y, NP   20 mg at 09/27/17 [id number]  . hydrOXYzine (ATARAX/VISTARIL) tablet 25 mg  25 mg Oral Q6H PRN Cobos, Rockey SituFernando A, MD   25 mg at 09/26/17 1846  . ibuprofen (ADVIL,MOTRIN) tablet 600 mg  600 mg Oral Q6H PRN Oneta RackLewis, Tanika N, NP   600 mg at 09/26/17 1454  . Influenza vac split quadrivalent PF (FLUARIX) injection 0.5 mL  0.5 mL Intramuscular Tomorrow-1000 Cobos, Fernando A, MD      . loperamide (IMODIUM) capsule 2-4 mg  2-4 mg Oral PRN Cobos, Rockey SituFernando A, MD      . LORazepam (ATIVAN) tablet 1 mg  1 mg Oral Q6H PRN Cobos, Fernando A, MD      . magnesium hydroxide (MILK OF MAGNESIA) suspension 30 mL  30 mL Oral Daily PRN Charm RingsLord, Jamison Y, NP      . multivitamin with minerals tablet 1 tablet  1 tablet Oral Daily Cobos, Rockey SituFernando A, MD   1 tablet at 09/27/17 [phone]  . nicotine (NICODERM CQ - dosed in mg/24 hours) patch 21 mg  21 mg Transdermal Daily Oneta RackLewis, Tanika N, NP   21 mg at 09/27/17 0742  . ondansetron (ZOFRAN-ODT) disintegrating tablet 4 mg  4 mg Oral Q6H PRN Cobos, Fernando A, MD      . thiamine (VITAMIN B-1) tablet 100 mg  100 mg Oral Daily Cobos, Rockey SituFernando A, MD   100 mg at 09/27/17 0742  . traZODone (DESYREL) tablet 50  mg  50 mg Oral QHS,MR X 1 Nira ConnBerry, Jason A, NP   50 mg at 09/26/17 2210   PTA Medications: No prescriptions prior to admission.    Patient Stressors: Financial difficulties Health problems Legal issue  Patient Strengths: Ability for insight Active sense of humor Average or above average intelligence Capable of independent living  Treatment Modalities: Medication Management, Group therapy, Case management,  1 to 1 session with clinician, Psychoeducation, Recreational therapy.   Physician Treatment Plan for Primary Diagnosis: Major depressive disorder, single episode, severe without psychosis (HCC) Long Term Goal(s): Improvement in symptoms so as ready for discharge Improvement in symptoms so as ready for discharge   Short Term Goals: Compliance with prescribed medications will improve Ability to identify triggers associated with substance abuse/mental health issues will improve Ability to disclose and discuss suicidal ideas Ability to demonstrate self-control will improve  Medication Management: Evaluate patient's response, side effects, and tolerance of medication regimen.  Therapeutic Interventions: 1 to 1 sessions, Unit Group sessions and Medication administration.  Evaluation of Outcomes: Progressing  Physician Treatment Plan for Secondary Diagnosis: Principal Problem:   Major depressive disorder, single episode, severe without psychosis (HCC)  Long Term Goal(s): Improvement in symptoms so as ready for discharge Improvement in symptoms so as ready for  discharge   Short Term Goals: Compliance with prescribed medications will improve Ability to identify triggers associated with substance abuse/mental health issues will improve Ability to disclose and discuss suicidal ideas Ability to demonstrate self-control will improve     Medication Management: Evaluate patient's response, side effects, and tolerance of medication regimen.  Therapeutic Interventions: 1 to 1 sessions,  Unit Group sessions and Medication administration.  Evaluation of Outcomes: Progressing   RN Treatment Plan for Primary Diagnosis: Major depressive disorder, single episode, severe without psychosis (HCC) Long Term Goal(s): Knowledge of disease and therapeutic regimen to maintain health will improve  Short Term Goals: Ability to remain free from injury will improve, Ability to verbalize feelings will improve and Ability to disclose and discuss suicidal ideas  Medication Management: RN will administer medications as ordered by provider, will assess and evaluate patient's response and provide education to patient for prescribed medication. RN will report any adverse and/or side effects to prescribing provider.  Therapeutic Interventions: 1 on 1 counseling sessions, Psychoeducation, Medication administration, Evaluate responses to treatment, Monitor vital signs and CBGs as ordered, Perform/monitor CIWA, COWS, AIMS and Fall Risk screenings as ordered, Perform wound care treatments as ordered.  Evaluation of Outcomes: Progressing   LCSW Treatment Plan for Primary Diagnosis: Major depressive disorder, single episode, severe without psychosis (HCC) Long Term Goal(s): Safe transition to appropriate next level of care at discharge, Engage patient in therapeutic group addressing interpersonal concerns.  Short Term Goals: Engage patient in aftercare planning with referrals and resources, Facilitate patient progression through stages of change regarding substance use diagnoses and concerns and Identify triggers associated with mental health/substance abuse issues  Therapeutic Interventions: Assess for all discharge needs, 1 to 1 time with Social worker, Explore available resources and support systems, Assess for adequacy in community support network, Educate family and significant other(s) on suicide prevention, Complete Psychosocial Assessment, Interpersonal group therapy.  Evaluation of Outcomes:  Progressing   Progress in Treatment: Attending groups: Yes. Participating in groups: Yes. Taking medication as prescribed: Yes. Toleration medication: Yes. Family/Significant other contact made: SPE completed with pt; pt declined to consent to family contact.  Patient understands diagnosis: Yes. Discussing patient identified problems/goals with staff: Yes. Medical problems stabilized or resolved: Yes. Denies suicidal/homicidal ideation: Yes. Issues/concerns per patient self-inventory: No. Other: n/a   New problem(s) identified: No, Describe:  n/a  New Short Term/Long Term Goal(s): Medication management for mood stabilization; development of comprehensive mental wellness/sobriety plan; elimination of SI thoughts.   Discharge Plan or Barriers: CSW assessing for appropriate referrals--pt plans to return home; follow-up with Dr. Lily Kocher at [facility] for medication management. Pt is not currently interested in further referrals. [facility]AG pamphlet and AA/NA list for [facility] provided to pt for additional community support.    Reason for Continuation of Hospitalization: Depression Medical Issues Medication stabilization Suicidal ideation  Estimated Length of Stay: Thursday, 09/30/17  Attendees: Patient: 09/27/2017 11:11 AM  Physician: Dr. Jama Flavors MD; Dr. Altamese London MD 09/27/2017 11:11 AM  Nursing: Jesusita Oka RN 09/27/2017 11:11 AM  RN Care Manager: Onnie Boer CM 09/27/2017 11:11 AM  Social Worker: Chartered loss adjuster, LCSW 09/27/2017 11:11 AM  Recreational Therapist: x 09/27/2017 11:11 AM  Other: Armandina Stammer NP; Hillery Jacks NP 09/27/2017 11:11 AM  Other:  09/27/2017 11:11 AM  Other: 09/27/2017 11:11 AM    Scribe for Treatment Team: Ledell Peoples Smart, LCSW 09/27/2017 11:11 AM

## 2017-09-27 NOTE — Progress Notes (Signed)
Recreation Therapy Notes  Date:  09/27/17 Time: 0930 Location: 300 [address] Dayroom  Group Topic: Stress Management  Goal Area(s) Addresses:  Patient will verbalize importance of using healthy stress management.  Patient will identify positive emotions associated with healthy stress management.   Intervention: Stress Management  Activity :  Meditation.  LRT introduced the stress management technique of meditation.  LRT played Daniels meditation leading patients through Daniels body scan that allowed them to take note of the sensations they may be feeling.  Patients were to follow along as meditation played.  Education:  Stress Management, Discharge Planning.   Education Outcome: Acknowledges edcuation/In group clarification offered/Needs additional education  Clinical Observations/Feedback: Pt did not attend group.   Douglas Daniels, LRT/CTRS         Douglas Daniels 09/27/2017 11:14 AM 

## 2017-09-27 NOTE — Progress Notes (Signed)
[facility] MD Progress Note  09/27/2017 3:03 PM Douglas Daniels  MRN:  [id number]  Subjective:  Douglas Daniels reports "I came here because I was feeling suicidal & thoughts about hurting myself. Today, it comes & goes,. I have this racing mind. It will not quit. I cannot concentrate, I can get manic, never depression. Even at this point, I'm not feeling depressed, but they are giving me depression medicine. I don't sleep well because my mind is on going all the time. I was on Seroquel in the past, stopped it, did not like how it made me feel. I'm not a depressed kind of person, manicy I would rather say".  Objective: Douglas Daniels  is awake, alert and oriented. Seen in his room. Patient presents with a mild pressutred speech. He denies depression. Denies suicidal or homicidal ideation during this assessment. Denies auditory or visual hallucination and does not appear to be responding to internal stimuli. He reports problems with racing thoughts & insomnia. He is endorsing passive suicidal ideations. Denies any plans or intent.  Support, encouragement and reassurance was provided.   Principal Problem: Major depressive disorder, single episode, severe without psychosis (HCC) Diagnosis:   Patient Active Problem List   Diagnosis Date Noted  . Major depressive disorder, single episode, severe without psychotic features (HCC) [F32.2] 09/24/2017  . Alcohol use disorder, moderate, dependence (HCC) [F10.20] 09/24/2017  . Major depressive disorder, single episode, severe without psychosis (HCC) [F32.2] 09/24/2017  . Phlebitis [I80.9]   . MSSA bacteremia [R78.81] 05/29/2017  . Septic thrombophlebitis of upper extremity [I80.8] 05/29/2017  . Hypokalemia [E87.6] 05/29/2017  . Cellulitis of left arm [L03.114] 05/27/2017  . Sepsis (HCC) [A41.9] 05/27/2017  . Fall [W19.XXXA] 05/18/2017  . Multiple fractures of ribs, left side, initial encounter for closed fracture [S22.42XA] 05/15/2017   Total Time spent with patient: 25  minutes  Past Psychiatric History: Alcoholism,  Past Medical History:  Past Medical History:  Diagnosis Date  . Cellulitis 05/2017   left arm  . Fall 05/18/2017  . Rib fractures 05/2017  . Staph infection     Past Surgical History:  Procedure Laterality Date  . NO PAST SURGERIES     Family History: History reviewed. No pertinent family history.  Family Psychiatric  History: See H&P.  Social History:  History  Alcohol Use  . Yes    Comment: moderate use      History  Drug Use  . Types: Marijuana    Social History   Social History  . Marital status: Single    Spouse name: N/A  . Number of children: N/A  . Years of education: N/A   Social History Main Topics  . Smoking status: Former Smoker    Packs/day: 2.00    Years: 5.00    Types: Cigarettes  . Smokeless tobacco: Current User    Types: Chew  . Alcohol use Yes     Comment: moderate use   . Drug use: Yes    Types: Marijuana  . Sexual activity: Not Asked   Other Topics Concern  . None   Social History Narrative  . None   Additional Social History:  History of alcohol / drug use?: Yes Negative Consequences of Use: Legal, Personal relationships, Financial Withdrawal Symptoms: Agitation Name of Substance 1: Tobacco/Cigarettes. 1 - Amount (size/oz): Pt reported, he chews tobacco. Pt reported, smoking three cigarettes daily.  1 - Duration: Ongoing. 1 - Last Use / Amount: Pt reported, daily. Name of Substance 2: Alochol 2 - Amount (size/oz): Pt  reported, he drank six beers last night.  2 - Frequency: Pt reported, be has cut back on his drinking.  2 - Last Use / Amount: Pt reported, last night.   Sleep: Fair  Appetite:  Fair  Current Medications: Current Facility-Administered Medications  Medication Dose Route Frequency Provider Last Rate Last Dose  . acetaminophen (TYLENOL) tablet 650 mg  650 mg Oral Q6H PRN Charm RingsLord, Jamison Y, NP   650 mg at 09/26/17 1214  . alum & mag hydroxide-simeth  (MAALOX/MYLANTA) 200-200-20 MG/5ML suspension 30 mL  30 mL Oral Q6H PRN Charm RingsLord, Jamison Y, NP   30 mL at 09/26/17 1455  . ARIPiprazole (ABILIFY) tablet 5 mg  5 mg Oral Daily Nwoko, Agnes I, NP      . Melene Muller[START ON 09/28/2017] FLUoxetine (PROZAC) capsule 10 mg  10 mg Oral Daily Nwoko, Agnes I, NP      . hydrOXYzine (ATARAX/VISTARIL) tablet 25 mg  25 mg Oral Q6H PRN Nwoko, Agnes I, NP      . ibuprofen (ADVIL,MOTRIN) tablet 600 mg  600 mg Oral Q6H PRN Oneta RackLewis, Tanika N, NP   600 mg at 09/26/17 1454  . Influenza vac split quadrivalent PF (FLUARIX) injection 0.5 mL  0.5 mL Intramuscular Tomorrow-1000 Cobos, Fernando A, MD      . loperamide (IMODIUM) capsule 2-4 mg  2-4 mg Oral PRN Cobos, Rockey SituFernando A, MD      . magnesium hydroxide (MILK OF MAGNESIA) suspension 30 mL  30 mL Oral Daily PRN Charm RingsLord, Jamison Y, NP      . multivitamin with minerals tablet 1 tablet  1 tablet Oral Daily Cobos, Rockey SituFernando A, MD   1 tablet at 09/27/17 [phone]  . nicotine (NICODERM CQ - dosed in mg/24 hours) patch 21 mg  21 mg Transdermal Daily Oneta RackLewis, Tanika N, NP   21 mg at 09/27/17 0742  . ondansetron (ZOFRAN-ODT) disintegrating tablet 4 mg  4 mg Oral Q6H PRN Cobos, Fernando A, MD      . thiamine (VITAMIN B-1) tablet 100 mg  100 mg Oral Daily Cobos, Rockey SituFernando A, MD   100 mg at 09/27/17 0742  . traZODone (DESYREL) tablet 50 mg  50 mg Oral QHS,MR X 1 Nira ConnBerry, Jason A, NP   50 mg at 09/26/17 2210   Lab Results: No results found for this or any previous visit (from the past 48 hour(s)).  Blood Alcohol level:  Lab Results  Component Value Date   ETH 48 (H) 09/24/2017   ETH 292 (H) 05/15/2017   Metabolic Disorder Labs: No results found for: HGBA1C, MPG No results found for: PROLACTIN Lab Results  Component Value Date   CHOL 153 07/20/2017   TRIG 96 07/20/2017   HDL 57 07/20/2017   CHOLHDL 2.7 07/20/2017   LDLCALC 77 07/20/2017   Physical Findings: AIMS: Facial and Oral Movements Muscles of Facial Expression: None, normal Lips and  Perioral Area: None, normal Jaw: None, normal Tongue: None, normal,Extremity Movements Upper (arms, wrists, hands, fingers): None, normal Lower (legs, knees, ankles, toes): None, normal, Trunk Movements Neck, shoulders, hips: None, normal, Overall Severity Severity of abnormal movements (highest score from questions above): None, normal Incapacitation due to abnormal movements: None, normal Patient's awareness of abnormal movements (rate only patient's report): No Awareness, Dental Status Current problems with teeth and/or dentures?: No Does patient usually wear dentures?: No  CIWA:  CIWA-Ar Total: 2 COWS:  COWS Total Score: 1  Musculoskeletal: Strength & Muscle Tone: within normal limits Gait & Station: normal Patient leans:  N/A  Psychiatric Specialty Exam: Physical Exam  Nursing note and vitals reviewed. Constitutional: He appears well-developed.  HENT:  Head: Normocephalic.  Neurological: He is alert.  Psychiatric: He has a normal mood and affect.    Review of Systems  Psychiatric/Behavioral: Positive for depression. The patient is nervous/anxious.     Blood pressure 118/83, pulse 96, temperature 98.5 F (36.9 C), temperature source Oral, resp. rate 16, height 6' (1.829 m), weight 81.6 kg (180 lb), SpO2 100 %.Body mass index is 24.41 kg/m.  General Appearance: Casual   Eye Contact:  Fair  Speech:  Clear and Coherent  Volume:  Normal  Mood:  Anxious and Depressed  Affect:  Depressed  Thought Process:  Coherent, Linear and Descriptions of Associations: Intact  Orientation:  Full (Time, Place, and Person)  Thought Content:  Logical  Suicidal Thoughts:  No  Homicidal Thoughts:  No  Memory:  Immediate;   Fair Recent;   Fair Remote;   Fair  Judgement:  Intact  Insight:  Fair  Psychomotor Activity:  Normal  Concentration:  Concentration: Fair  Recall:  [employer] of Knowledge:  Fair  Language:  Fair  Akathisia:  No  Handed:  Right  AIMS (if indicated):      Assets:  Communication Skills Leisure Time Physical Health  ADL's:  Intact  Cognition:  WNL  Sleep:  Number of Hours: 5   Treatment Plan Summary: Daily contact with patient to assess and evaluate symptoms and progress in treatment and Medication management   -Disontinue Prozac 20 mg, patient says he is not depressed. -Continue with Trazodone 50 mg  for insomnia -Continue the CWIA/Ativan Protocols for alcohol detox. -Initiated Abilify 5 mg daily for mood control/racing thoughts. Will continue to monitor vitals ,medication compliance and treatment side effects while patient is here.  Reviewed labs:BAL - 48, UDS - positive  THC  CSW will start working on disposition.  Patient to participate in therapeutic milieu  Sanjuana Kava, NP, PMHNP, FNP-BC. 09/27/2017, 3:03 PMPatient ID: Douglas Daniels, male   DOB: Aug 20, 1981, 36 y.o.   MRN: [id number]

## 2017-09-27 NOTE — Plan of Care (Signed)
Problem: Activity: Goal: Interest or engagement in activities will improve Outcome: Completed/Met Date Met: 09/27/17 Continues to demonstrate active participation in groups and milieu Goal: Sleeping patterns will improve Outcome: Progressing Reports middle insomnia, NP/MD notified  Problem: Education: Goal: Knowledge of [facility] Education information/materials will improve Outcome: Completed/Met Date Met: 09/27/17 Completed on admission

## 2017-09-27 NOTE — Progress Notes (Signed)
Nursing Note 09/27/2017 [id number]  Data Reports sleeping poor with PRN sleep med.  Rates depression 7/10, hopelessness 5/10, and anxiety 8/10. Affect wide ranged and appropriate.  Denies HI, SI, AVH.  Requesting PRN's for anxiety throughout day, took first dose of Abilify this afternoon after receiving education. Interacting well with staff and peers, spends free time in day area, attending groups.   Action Spoke with patient 1:1, nurse offered support to patient throughout shift.  Continues to be monitored on 15 minute checks for safety.  Response Remains safe and appropriate on unit.

## 2017-09-28 MED ORDER — NICOTINE 21 MG/24HR TD PT24: 21.0000 mg | MEDICATED_PATCH | Freq: Every day | TRANSDERMAL | 0 refills | Status: AC

## 2017-09-28 MED ORDER — TRAZODONE HCL 100 MG PO TABS: 100.0000 mg | ORAL_TABLET | Freq: Every day | ORAL | 0 refills | Status: AC

## 2017-09-28 MED ORDER — ARIPIPRAZOLE 5 MG PO TABS: 5.0000 mg | ORAL_TABLET | Freq: Every day | ORAL | 0 refills | Status: AC

## 2017-09-28 MED ORDER — HYDROXYZINE HCL 25 MG PO TABS: 25.0000 mg | ORAL_TABLET | Freq: Four times a day (QID) | ORAL | 0 refills | Status: AC | PRN

## 2017-09-28 NOTE — Discharge Summary (Signed)
Physician Discharge Summary Note  Patient:  Douglas Daniels is an 36 y.o., male MRN:  [id number] DOB:  03-08-1981 Patient phone:  [phone] (home)  Patient address:   [place] [address],  Total Time spent with patient: Greater than 30 minutes  Date of Admission:  09/24/2017 Date of Discharge: 09-28-17  Reason for Admission: Suicidal ideations.  Principal Problem: Substance induced mood disorder [facility])  Discharge Diagnoses: Patient Active Problem List   Diagnosis Date Noted  . Substance induced mood disorder ([place]) [F19.94] 09/27/2017    Priority: High  . Major depressive disorder, single episode, severe without psychotic features ([place]) [F32.2] 09/24/2017  . Alcohol use disorder, moderate, dependence ([place]) [F10.20] 09/24/2017  . Major depressive disorder, single episode, severe without psychosis ([place]) [F32.2] 09/24/2017  . Phlebitis [I80.9]   . MSSA bacteremia [R78.81] 05/29/2017  . Septic thrombophlebitis of upper extremity [I80.8] 05/29/2017  . Hypokalemia [E87.6] 05/29/2017  . Cellulitis of left arm [L03.114] 05/27/2017  . Sepsis ([place]) [A41.9] 05/27/2017  . Fall [W19.XXXA] 05/18/2017  . Multiple fractures of ribs, left side, initial encounter for closed fracture [S22.42XA] 05/15/2017   Past Psychiatric History: Alcoholism, THC use disorder.  Past Medical History:  Past Medical History:  Diagnosis Date  . Cellulitis 05/2017   left arm  . Fall 05/18/2017  . Rib fractures 05/2017  . Staph infection     Past Surgical History:  Procedure Laterality Date  . NO PAST SURGERIES     Family History: History reviewed. No pertinent family history.  Family Psychiatric  History: See H&P  Social History:  History  Alcohol Use  . Yes    Comment: moderate use      History  Drug Use  . Types: Marijuana    Social History   Social History  . Marital status: Single    Spouse name: N/A  . Number of children: N/A  . Years of education: N/A   Social  History Main Topics  . Smoking status: Former Smoker    Packs/day: 2.00    Years: 5.00    Types: Cigarettes  . Smokeless tobacco: Current User    Types: Chew  . Alcohol use Yes     Comment: moderate use   . Drug use: Yes    Types: Marijuana  . Sexual activity: Not Asked   Other Topics Concern  . None   Social History Narrative  . None   Hospital Course: (Per Md's SRA): 37 y.o Caucasian male, single, lives alone, employed. Background history of SUD and mood disorder. Presented to the ER unaccompanied. Reported suicidal thoughts at presentation. Routine labs  Are within normal limits.  UDS positive for THC.  BAL 48 mg/dl at presentation.   After evaluation of his presenting symptoms & a review of his UDS & toxicology test reports, it was determined that Douglas Daniels will require alcohol detoxification as well as mood stabilization treatments. He received Ativan detox protocols. He was also medicated & discharged on; Abilify 5 mg for mood control, Hydroxyzine 25 mg prn for anxiety, Nicotine patch 21 mg for smoking cessation & Trazodone 100 mg for insomnia. He was enrolled & participated in the group counseling sessions being offered & held on this unit. He learned coping skills. He presented no other significant health issues that required treatment. He tolerated his treatment regimen without any adverse effects or reactions reported.  Douglas Daniels's chart was reviewed today & his case discussed at the treatment team meeting today. The team reports that he is  doing well and scheduled for discharge today to continue mental health care & substance abuse treatment on an outpatient basis as noted below..   The attending psychiatrist met with him for the first time toady. Douglas Daniels says he had been drinking heavily. Says while intoxicated, he starts having these bad thoughts. Says he talked to a friend who advised him to seek help. Patient states that by the time he got here, he was already feeling better. He  reports that he feels great now. He has not had any suicidal thoughts since he has been here. Reports that he is in good spirits. Not feeling depressed. Reports normal energy and interest. Has been maintaining normal biological functions. He is able to think clearly. He is able to focus on task. His thoughts are not crowded or racing. No evidence of mania. No hallucination in any modality. He is not making any delusional statement. No passivity of will/thought. He is fully in touch with reality.  No thoughts of homicide. No violent thoughts. No overwhelming anxiety. No craving for substances. No access to weapons.   Douglas Daniels was switched to a mood stabilizer yesterday. Says he feels good on his current regimen. No side effects. He plans to follow up as recommended. Requested for a letter for his employers as he plans to get back to work soon. He was provided with a 7 days worth, supply samples of his [facility] discharge medications. He left [facility] with all personal belongings in no apparent distress. Transportation per family.   Physical Findings: AIMS: Facial and Oral Movements Muscles of Facial Expression: None, normal Lips and Perioral Area: None, normal Jaw: None, normal Tongue: None, normal,Extremity Movements Upper (arms, wrists, hands, fingers): None, normal Lower (legs, knees, ankles, toes): None, normal, Trunk Movements Neck, shoulders, hips: None, normal, Overall Severity Severity of abnormal movements (highest score from questions above): None, normal Incapacitation due to abnormal movements: None, normal Patient's awareness of abnormal movements (rate only patient's report): No Awareness, Dental Status Current problems with teeth and/or dentures?: No Does patient usually wear dentures?: No  CIWA:  CIWA-Ar Total: 2 COWS:  COWS Total Score: 1  Musculoskeletal: Strength & Muscle Tone: within normal limits Gait & Station: normal Patient leans: N/A  Psychiatric Specialty Exam: Physical Exam   Constitutional: He appears well-developed.  HENT:  Head: Normocephalic.  Eyes: Pupils are equal, round, and reactive to light.  Neck: Normal range of motion.  Cardiovascular: Normal rate.   Respiratory: Effort normal.  GI: Soft.  Genitourinary:  Genitourinary Comments: Deferred  Musculoskeletal: Normal range of motion.  Neurological: He is alert.  Skin: Skin is warm.    Review of Systems  Constitutional: Negative.   HENT: Negative.   Eyes: Negative.   Respiratory: Negative.   Cardiovascular: Negative.   Gastrointestinal: Negative.   Genitourinary: Negative.   Musculoskeletal: Negative.   Skin: Negative.   Neurological: Negative.   Endo/Heme/Allergies: Negative.   Psychiatric/Behavioral: Positive for depression (Stable) and substance abuse (Hx. alcoholism, THC use.). Negative for memory loss and suicidal ideas. The patient has insomnia (Stable). The patient is not nervous/anxious.     Blood pressure 118/83, pulse 96, temperature 98.5 F (36.9 C), temperature source Oral, resp. rate 16, height 6' (1.829 m), weight 81.6 kg (180 lb), SpO2 100 %.Body mass index is 24.41 kg/m.  See H&P   Have you used any form of tobacco in the last 30 days? (Cigarettes, Smokeless Tobacco, Cigars, and/or Pipes): Yes  Has this patient used any form of tobacco in the  last 30 days? (Cigarettes, Smokeless Tobacco, Cigars, and/or Pipes): Yes, provided with a nicotine patch 21 mg for smoking cessation.  Blood Alcohol level:  Lab Results  Component Value Date   ETH 48 (H) 09/24/2017   ETH 292 (H) 45/85/9292   Metabolic Disorder Labs:  No results found for: HGBA1C, MPG No results found for: PROLACTIN Lab Results  Component Value Date   CHOL 153 07/20/2017   TRIG 96 07/20/2017   HDL 57 07/20/2017   CHOLHDL 2.7 07/20/2017   LDLCALC 77 07/20/2017   See Psychiatric Specialty Exam and Suicide Risk Assessment completed by Attending Physician prior to discharge.  Discharge destination:  Home  Is  patient on multiple antipsychotic therapies at discharge:  No   Has Patient had three or more failed trials of antipsychotic monotherapy by history:  No  Recommended Plan for Multiple Antipsychotic Therapies: NA  Allergies as of 09/28/2017   No Known Allergies     Medication List    TAKE these medications     Indication  ARIPiprazole 5 MG tablet Commonly known as:  ABILIFY Take 1 tablet (5 mg total) by mouth daily. For mood control  Indication:  Mood control   hydrOXYzine 25 MG tablet Commonly known as:  ATARAX/VISTARIL Take 1 tablet (25 mg total) by mouth every 6 (six) hours as needed for anxiety.  Indication:  Feeling Anxious   nicotine 21 mg/24hr patch Commonly known as:  NICODERM CQ - dosed in mg/24 hours Place 1 patch (21 mg total) onto the skin daily. (May buy from over the counter): For smoking cessation  Indication:  Nicotine Addiction   traZODone 100 MG tablet Commonly known as:  DESYREL Take 1 tablet (100 mg total) by mouth at bedtime. For sleep  Indication:  [place] Follow up on 10/07/2017.   Why:  Hospital follow-up on 11/1 at 8:45AM with Domenica Fail. Thank you. Contact information: [place] [address] [phone]       Patient declined all other referrals. Follow up.          Follow-up recommendations: Activity:  As tolerated Diet: As recommended by your primary care doctor. Keep all scheduled follow-up appointments as recommended.   Comments: Patient is instructed prior to discharge to: Take all medications as prescribed by his/her mental healthcare provider. Report any adverse effects and or reactions from the medicines to his/her outpatient provider promptly. Patient has been instructed & cautioned: To not engage in alcohol and or illegal drug use while on prescription medicines. In the event of worsening symptoms,  patient is instructed to call the crisis hotline, 911 and or go to the nearest ED for appropriate evaluation and treatment of symptoms. To follow-up with his/her primary care provider for your other medical issues, concerns and or health care needs.   Signed: Encarnacion Slates, NP, PMHNP, FNP-BC 09/28/2017, 11:48 AM

## 2017-09-28 NOTE — BHH Suicide Risk Assessment (Signed)
[facility] Discharge Suicide Risk Assessment   Principal Problem: Substance induced mood disorder [facility]) Discharge Diagnoses:  Patient Active Problem List   Diagnosis Date Noted  . Substance induced mood disorder ([place]) [F19.94] 09/27/2017  . Major depressive disorder, single episode, severe without psychotic features ([place]) [F32.2] 09/24/2017  . Alcohol use disorder, moderate, dependence ([place]) [F10.20] 09/24/2017  . Major depressive disorder, single episode, severe without psychosis ([place]) [F32.2] 09/24/2017  . Phlebitis [I80.9]   . MSSA bacteremia [R78.81] 05/29/2017  . Septic thrombophlebitis of upper extremity [I80.8] 05/29/2017  . Hypokalemia [E87.6] 05/29/2017  . Cellulitis of left arm [L03.114] 05/27/2017  . Sepsis ([place]) [A41.9] 05/27/2017  . Fall [W19.XXXA] 05/18/2017  . Multiple fractures of ribs, left side, initial encounter for closed fracture [S22.42XA] 05/15/2017    Total Time spent with patient: 30 minutes  Musculoskeletal: Strength & Muscle Tone: within normal limits Gait & Station: normal Patient leans: N/A  Psychiatric Specialty Exam: Review of Systems  Constitutional: Negative.   HENT: Negative.   Eyes: Negative.   Respiratory: Negative.   Cardiovascular: Negative.   Gastrointestinal: Negative.   Genitourinary: Negative.   Musculoskeletal: Negative.   Skin: Negative.   Neurological: Negative.   Endo/Heme/Allergies: Negative.   Psychiatric/Behavioral: Negative for depression, hallucinations, memory loss and suicidal ideas. The patient is not nervous/anxious and does not have insomnia.     Blood pressure 118/83, pulse 96, temperature 98.5 F (36.9 C), temperature source Oral, resp. rate 16, height 6' (1.829 m), weight 81.6 kg (180 lb), SpO2 100 %.Body mass index is 24.41 kg/m.  General Appearance: Neatly dressed, pleasant, engaging well and cooperative. Appropriate behavior. Not in any distress. Good relatedness. Not internally stimulated.  Eye Contact::  Good   Speech:  Spontaneous, normal prosody. Normal tone and rate.   Volume:  Normal  Mood:  Euthymic  Affect:  Appropriate and Full Range  Thought Process:  Linear  Orientation:  Full (Time, Place, and Person)  Thought Content:  Future oriented. No delusional theme. No preoccupation with violent thoughts. No negative ruminations. No obsession.  No hallucination in any modality.   Suicidal Thoughts:  No  Homicidal Thoughts:  No  Memory:  Immediate;   Good Recent;   Good Remote;   Good  Judgement:  Good  Insight:  Good  Psychomotor Activity:  Normal  Concentration:  Good  Recall:  Good  Fund of Knowledge:Good  Language: Good  Akathisia:  Negative  Handed:    AIMS (if indicated):     Assets:  Communication Skills Desire for Improvement Financial Resources/Insurance Housing Physical Health Resilience Social Support Talents/Skills Transportation Vocational/Educational  Sleep:  Number of Hours: 6.25  Cognition: WNL  ADL's:  Intact   Clinical Assessment::   36 y.o Caucasian male, single, lives alone, employed. Background history of SUD and mood disorder. Presented to the ER unaccompanied. Reported suicidal thoughts at presentation. Routine labs  Are within normal limits.  UDS positive for THC.  BAL 48 mg/dl at presentation.   Chart reviewed today. Patient discussed at team today. Team reports he is doing well and scheduled for discharge today.   I met with him for the first time toady. Says he had been drinking heavily. Says while intoxicated, he starts having these bad thoughts. Says he talked to a friend who advised him to seek help. Patient states that by the time he got here, he was already feeling better. Tells me that he feels great now. He has not had any suicidal thoughts since he has been here.  Reports that he is in good spirits. Not feeling depressed. Reports normal energy and interest. Has been maintaining normal biological functions. He is able to think clearly. He is able  to focus on task. His thoughts are not crowded or racing. No evidence of mania. No hallucination in any modality. He is not making any delusional statement. No passivity of will/thought. He is fully in touch with reality.  No thoughts of homicide. No violent thoughts. No overwhelming anxiety. No craving for substances. No access to weapons. Patient was switched to a mood stabilizer yesterday. Says he feels good on his current regimen. No side effects. He plans to follow up as recommended. Requested for a letter for his employers as he plans to get back to work soon.     Demographic Factors:  Living alone  Loss Factors: NA  Historical Factors: Impulsivity  Risk Reduction Factors:   Responsible for children under 60 years of age, Sense of responsibility to family, Employed, Positive social support, Positive therapeutic relationship and Positive coping skills or problem solving skills  Continued Clinical Symptoms:   as above   Cognitive Features That Contribute To Risk:  None    Suicide Risk:  Minimal: No identifiable suicidal ideation.  Patient is not having any thoughts of suicide at this time. Modifiable risk factors targeted during this admission includes depression and substance use. Demographical and historical risk factors cannot be modified. Patient is now engaging well. Patient is reliable and is future oriented. We have buffered patient's support structures. At this point, patient is at low risk of suicide. Patient is aware of the effects of psychoactive substances on decision making process. Patient has been provided with emergency contacts. Patient acknowledges to use resources provided if unforseen circumstances changes their current risk stratification.    Follow-up Information    [place] Follow up on 10/07/2017.   Why:  Hospital follow-up on 11/1 at 8:45AM with Domenica Fail. Thank you. Contact information: [place] [address] [phone]       Patient declined all other referrals. Follow up.           Plan Of Care/Follow-up recommendations:  1. Continue current psychotropic medications 2. Mental health and addiction follow up as arranged.  3. Discharge in care of his friend. 4. Provided limited quantity of prescriptions   [place], MD 09/28/2017, 10:46 AM

## 2017-09-28 NOTE — Progress Notes (Signed)
D: Patient denies SI, HI or AVH. Patient has a depressed mood with intermittently alternating flat/anxious affect.  Pt. Visualized during pleasant phone interaction and has been visualized in the dayroom interacting with staff and others on the unit.  Pt. Expressed interest in discharging tomorrow, inquiring about "any notes written about his discharge".  Pt requesting PRN medication for anxiety tonight.  Pt. States he plans to "go back to work" as soon as possible.  Pt. Denies any new complaints, states his appetite is good but sleep has not been great even with medication.  A: Patient given emotional support from RN. Patient encouraged to come to staff with concerns and/or questions. Patient's medication routine continued. Patient's orders and plan of care reviewed.   R: Patient remains appropriate and cooperative. Will continue to monitor patient q15 minutes for safety.

## 2017-09-28 NOTE — Progress Notes (Signed)
Recreation Therapy Notes  Animal-Assisted Activity (AAA) Program Checklist/Progress Notes Patient Eligibility Criteria Checklist & Daily Group note for Rec TxIntervention  Date: 10.23.2018 Time: 2:45pm Location: Adult Unit   AAA/T Program Assumption of Risk Form signed by Patient/ or Parent Legal Guardian Yes  Patient is free of allergies or sever asthma Yes  Patient reports no fear of animals Yes  Patient reports no history of cruelty to animals Yes  Patient understands his/her participation is voluntary Yes  Behavioral Response: Did not attend.   Douglas Daniels, LRT/CTRS        Douglas Daniels L 09/28/2017 3:43 PM 

## 2017-09-28 NOTE — Progress Notes (Addendum)
  [facility]charge [facility] :  Will you be returning to the same living situation after discharge:  Yes,  home At discharge, do you have transportation home?: Yes,  family member picking him up at 4:00PM Do you have the ability to pay for your medications: Yes,  mental health  Release of information consent forms completed and submitted to medical records.  Patient to Follow up at: Follow-up Information     [facility] Follow up on 10/07/2017.   Why:  Hospital follow-up on 11/1 at 8:45AM with Sindy Messingoger Gomez. Thank you. Contact information: [address] [place] [phone]       Patient declined all other referrals. Follow up.           Next level of care provider has access to [facility] Link:yes  Safety Planning and Suicide Prevention discussed: Yes,  SPE completed with pt; pt declined to consent to family conact. SPI pamphlet and mobile crisis information provided.  Have you used any form of tobacco in the last 30 days? (Cigarettes, Smokeless Tobacco, Cigars, and/or Pipes): Yes  Has patient been referred to the Quitline?: Patient refused referral  Patient has been referred for addiction treatment: Pt. refused referral  Pulte HomesHeather N Smart, LCSW 09/28/2017, 9:47 AM

## 2017-09-28 NOTE — Progress Notes (Signed)
Discharge note:  Patient discharged home per MD order.  Patient received all personal belongings from unit and locker.  Reviewed AVS/transition record with patient and he indicated understanding.  He denies any thoughts of self harm.  Patient received prescriptions for his medications.  [facility].  He declined all other follow ups.  Patient left ambulatory with a friend.

## 2017-10-07 ENCOUNTER — Inpatient Hospital Stay (INDEPENDENT_AMBULATORY_CARE_PROVIDER_SITE_OTHER): Payer: Self-pay | Admitting: Physician Assistant
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
